# Patient Record
Sex: Female | Born: 1960 | Race: Black or African American | Hispanic: No | Marital: Single | Smoking: Current some day smoker
Health system: Southern US, Community
[De-identification: ages and names within clinical notes are randomized; demographics above are authoritative.]

## PROBLEM LIST (undated history)

## (undated) ENCOUNTER — Encounter

## (undated) ENCOUNTER — Ambulatory Visit: Payer: MEDICARE | Attending: Emergency Medicine | Primary: Emergency Medicine

## (undated) ENCOUNTER — Telehealth

## (undated) ENCOUNTER — Ambulatory Visit: Payer: MEDICARE

## (undated) ENCOUNTER — Ambulatory Visit: Payer: PRIVATE HEALTH INSURANCE

## (undated) ENCOUNTER — Ambulatory Visit
Payer: MEDICARE | Attending: Rehabilitative and Restorative Service Providers" | Primary: Rehabilitative and Restorative Service Providers"

## (undated) ENCOUNTER — Encounter: Attending: Registered" | Primary: Registered"

## (undated) ENCOUNTER — Ambulatory Visit: Payer: Medicaid (Managed Care)

## (undated) ENCOUNTER — Ambulatory Visit

## (undated) ENCOUNTER — Encounter: Attending: Hematology & Oncology | Primary: Hematology & Oncology

## (undated) ENCOUNTER — Ambulatory Visit: Payer: MEDICAID

## (undated) ENCOUNTER — Encounter: Attending: Nurse Practitioner | Primary: Nurse Practitioner

## (undated) ENCOUNTER — Ambulatory Visit: Payer: PRIVATE HEALTH INSURANCE | Attending: Oncology | Primary: Oncology

## (undated) ENCOUNTER — Ambulatory Visit: Payer: MEDICARE | Attending: Physical Medicine & Rehabilitation | Primary: Physical Medicine & Rehabilitation

## (undated) ENCOUNTER — Encounter: Attending: Oncology | Primary: Oncology

## (undated) ENCOUNTER — Ambulatory Visit: Attending: Oncology | Primary: Oncology

## (undated) ENCOUNTER — Telehealth: Attending: Adult Health | Primary: Adult Health

## (undated) ENCOUNTER — Ambulatory Visit: Payer: Medicaid (Managed Care) | Attending: Internal Medicine | Primary: Internal Medicine

## (undated) ENCOUNTER — Ambulatory Visit: Attending: Family | Primary: Family

## (undated) ENCOUNTER — Encounter
Attending: Student in an Organized Health Care Education/Training Program | Primary: Student in an Organized Health Care Education/Training Program

## (undated) ENCOUNTER — Institutional Professional Consult (permissible substitution): Payer: MEDICARE

## (undated) DIAGNOSIS — D75839 Thrombocytosis, unspecified: Secondary | ICD-10-CM

## (undated) DIAGNOSIS — D473 Essential (hemorrhagic) thrombocythemia: Secondary | ICD-10-CM

## (undated) HISTORY — PX: ABDOMINAL HYSTERECTOMY: SHX81

---

## 2006-11-27 HISTORY — PX: CERVICAL FUSION: SHX112

## 2014-04-08 ENCOUNTER — Emergency Department: Payer: Self-pay | Admitting: Emergency Medicine

## 2014-04-08 LAB — BASIC METABOLIC PANEL
Anion Gap: 6 — ABNORMAL LOW (ref 7–16)
BUN: 23 mg/dL — AB (ref 7–18)
CO2: 28 mmol/L (ref 21–32)
Calcium, Total: 9.8 mg/dL (ref 8.5–10.1)
Chloride: 103 mmol/L (ref 98–107)
Creatinine: 0.91 mg/dL (ref 0.60–1.30)
EGFR (Non-African Amer.): >60
Glucose: 95 mg/dL (ref 65–99)
OSMOLALITY: 277 (ref 275–301)
POTASSIUM: 3.8 mmol/L (ref 3.5–5.1)
Sodium: 137 mmol/L (ref 136–145)

## 2014-04-08 LAB — CBC
HCT: 44.4 % (ref 35.0–47.0)
HGB: 14.7 g/dL (ref 12.0–16.0)
MCH: 31.2 pg (ref 26.0–34.0)
MCHC: 33.2 g/dL (ref 32.0–36.0)
MCV: 94 fL (ref 80–100)
Platelet: 766 10*3/uL — ABNORMAL HIGH (ref 150–440)
RBC: 4.73 10*6/uL (ref 3.80–5.20)
RDW: 15 % — ABNORMAL HIGH (ref 11.5–14.5)
WBC: 7.5 10*3/uL (ref 3.6–11.0)

## 2014-04-08 LAB — D-DIMER(ARMC): D-DIMER: 236 ng/mL

## 2015-01-14 ENCOUNTER — Emergency Department: Payer: Self-pay | Admitting: Emergency Medicine

## 2015-07-22 ENCOUNTER — Encounter: Payer: Self-pay | Admitting: Emergency Medicine

## 2015-07-22 ENCOUNTER — Emergency Department
Admission: EM | Admit: 2015-07-22 | Discharge: 2015-07-22 | Disposition: A | Discharge status: Home / Self Care | Payer: Medicare Other | Attending: Emergency Medicine | Admitting: Emergency Medicine

## 2015-07-22 DIAGNOSIS — Z791 Long term (current) use of non-steroidal anti-inflammatories (NSAID): Secondary | ICD-10-CM | POA: Insufficient documentation

## 2015-07-22 DIAGNOSIS — Z79899 Other long term (current) drug therapy: Secondary | ICD-10-CM | POA: Insufficient documentation

## 2015-07-22 DIAGNOSIS — R42 Dizziness and giddiness: Secondary | ICD-10-CM | POA: Insufficient documentation

## 2015-07-22 DIAGNOSIS — Z7982 Long term (current) use of aspirin: Secondary | ICD-10-CM | POA: Insufficient documentation

## 2015-07-22 MED ORDER — VENLAFAXINE HCL ER 75 MG PO CP24: 75.0000 mg | ORAL_CAPSULE | Freq: Every day | ORAL | Status: AC

## 2015-07-22 MED ORDER — VENLAFAXINE HCL ER 75 MG PO CP24
75.0000 mg | ORAL_CAPSULE | Freq: Once | ORAL | Status: AC
  Administered 2015-07-22: 75 mg via ORAL
  Filled 2015-07-22: qty 1

## 2015-07-22 NOTE — ED Notes (Signed)
Pharmacy called about medication. Corene Cornea confirmed medication would be sent up as soon as possible.

## 2015-07-22 NOTE — ED Notes (Signed)
Pt assisted into room by registration. Care to be assumed.

## 2015-07-22 NOTE — ED Notes (Signed)
Patient is resting comfortably.  MD at bedside.

## 2015-07-22 NOTE — ED Notes (Signed)
Pt. States she has been on pristiq for the past year.  Pt. States she ran out of medication 3 days ago and is in the process of changing providers.  Pt. States feeling anxiety today with dizziness and nausea.

## 2015-07-22 NOTE — ED Notes (Signed)
Pt stated she is in the process of switching doctors, and she has run out of her anti-anxiety medication. Pt stated [place] will not see her until the receive her records from her prior PCP.

## 2015-07-22 NOTE — ED Notes (Signed)
Pt with c/o nausea and dizziness off medication, states she can barely open her eyes.

## 2015-07-22 NOTE — ED Provider Notes (Signed)
[facility] Emergency Department Provider Note  ____________________________________________  Time seen: Approximately 182 PM  I have reviewed the triage vital signs and the nursing notes.   HISTORY  Chief Complaint Dizziness    HPI Regina Mullins is a 54 y.o. female with a history of anxiety on Pristiq who presents with dizziness. The patient says that her dizziness has been increasing over the past 2-3 days and she has run out of her Pristiq. She said that she has had this exact presentation in the past when she has been out of her medication. She says that she is currently switching from [facility] over to the [place] and is in between primary care providers. Says that the dizziness is making her nauseous but has not any vomiting. Says that the sensation is of a spinning feeling. No ringing or roaring in her ears. No recent URI symptoms. No focal weakness or numbness. Patient also with history of thrombocytosis with labs drawn on 823 and platelets of 487.   History reviewed. No pertinent past medical history.  There are no active problems to display for this patient.   Past Surgical History  Procedure Laterality Date  . Abdominal hysterectomy    . Cervical fusion  2008    Current Outpatient Rx  Name  Route  Sig  Dispense  Refill  . aspirin EC 325 MG tablet   Oral   Take 325 mg by mouth daily.         . Cholecalciferol (VITAMIN D3) 2000 UNITS TABS   Oral   Take 1 tablet by mouth daily.      1   . etodolac (LODINE) 400 MG tablet   Oral   Take 400 mg by mouth daily.         . hydroxyurea (HYDREA) 500 MG capsule   Oral   Take 4 capsules by mouth daily.      0   . PRISTIQ 50 MG 24 hr tablet   Oral   Take 50 mg by mouth 2 (two) times daily.      0     Dispense as written.     Allergies Shellfish allergy and Codeine  No family history on file.  Social History Social History  Substance Use Topics  . Smoking status: Never  Smoker   . Smokeless tobacco: None  . Alcohol Use: No    Review of Systems Constitutional: No fever/chills Eyes: No visual changes. ENT: No sore throat. Cardiovascular: Denies chest pain. Respiratory: Denies shortness of breath. Gastrointestinal: No abdominal pain.  No nausea, .  No diarrhea.  No constipation. Genitourinary: Negative for dysuria. Musculoskeletal: Negative for back pain. Skin: Negative for rash. Neurological: Negative for headaches, focal weakness or numbness.  10-point ROS otherwise negative.  ____________________________________________   PHYSICAL EXAM:  VITAL SIGNS: ED Triage Vitals  Enc Vitals Group     BP 07/22/15 1927 124/71 mmHg     Pulse Rate 07/22/15 1927 93     Resp 07/22/15 1927 17     Temp 07/22/15 1927 98.3 F (36.8 C)     Temp Source 07/22/15 1927 Oral     SpO2 07/22/15 1927 96 %     Weight 07/22/15 1927 262 lb (118.842 kg)     Height 07/22/15 1927 5\' 3"  (1.6 m)     Head Cir --      Peak Flow --      Pain Score 07/22/15 1934 0     Pain Loc --  Pain Edu? --      Excl. in [place]? --     Constitutional: Alert and oriented. Well appearing and in no acute distress. Eyes: Conjunctivae are normal. PERRL. EOMI. Head: Atraumatic. Nose: No congestion/rhinnorhea. Mouth/Throat: Mucous membranes are moist.  Oropharynx non-erythematous. Neck: No stridor.   Cardiovascular: Normal rate, regular rhythm. Grossly normal heart sounds.  Good peripheral circulation. Respiratory: Normal respiratory effort.  No retractions. Lungs CTAB. Gastrointestinal: Soft and nontender. No distention. No abdominal bruits. No CVA tenderness. Musculoskeletal: No lower extremity tenderness nor edema.  No joint effusions. Neurologic:  Normal speech and language. No gross focal neurologic deficits are appreciated. Negative for ataxia on finger to nose testing. No nystagmus. Skin:  Skin is warm, dry and intact. No rash noted. Psychiatric: Mood and affect are normal. Speech  and behavior are normal.  ____________________________________________   LABS (all labs ordered are listed, but only abnormal results are displayed)  Labs Reviewed - No data to display ____________________________________________  EKG   ____________________________________________  RADIOLOGY   ____________________________________________   PROCEDURES    ____________________________________________   INITIAL IMPRESSION / ASSESSMENT AND PLAN / ED COURSE  Pertinent labs & imaging results that were available during my care of the patient were reviewed by me and considered in my medical decision making (see chart for details).  ----------------------------------------- 10:55 PM on 07/22/2015 -----------------------------------------  After dose of venlafaxine the patient is feeling back to her baseline. She was able to ambulate without any assistance and is no longer dizzy. 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 She says that due to her current insurance that venlafaxine would be covered and will be more affordable for her. Would prefer this as an outpatient. We'll prescribe venlafaxine for her and she will follow-up at the [facility] clinic where she is currently in the process of scheduling a primary care appointment. ____________________________________________   FINAL CLINICAL IMPRESSION(S) / ED DIAGNOSES  Acute dizziness, from withdrawal of Pristiq per the patient. Initial visit.    Orbie Pyo, MD 07/22/15 [phone]

## 2015-07-22 NOTE — Discharge Instructions (Signed)

## 2015-07-22 NOTE — ED Notes (Signed)
Discharge instructions reviewed with pt, RX given. All questions answered, pt to be d/c'd home.

## 2015-07-22 NOTE — ED Notes (Signed)
Patient is resting comfortably. Pt denies pain, just with complaint of slight nausea.

## 2015-07-22 NOTE — ED Notes (Signed)
Regina Mullins given to pt .

## 2015-07-22 NOTE — ED Notes (Signed)
Pristiq 50mg  BID PO.

## 2015-07-22 NOTE — ED Notes (Signed)
MD at bedside. 

## 2015-07-23 LAB — GLUCOSE, CAPILLARY: Glucose-Capillary: 79 mg/dL (ref 65–99)

## 2016-05-16 ENCOUNTER — Encounter: Payer: Self-pay | Admitting: Emergency Medicine

## 2016-05-16 ENCOUNTER — Emergency Department
Admission: EM | Admit: 2016-05-16 | Discharge: 2016-05-16 | Disposition: A | Discharge status: Home / Self Care | Payer: Medicare Other | Attending: Emergency Medicine | Admitting: Emergency Medicine

## 2016-05-16 DIAGNOSIS — Y929 Unspecified place or not applicable: Secondary | ICD-10-CM | POA: Insufficient documentation

## 2016-05-16 DIAGNOSIS — W57XXXA Bitten or stung by nonvenomous insect and other nonvenomous arthropods, initial encounter: Secondary | ICD-10-CM | POA: Insufficient documentation

## 2016-05-16 DIAGNOSIS — S30861A Insect bite (nonvenomous) of abdominal wall, initial encounter: Secondary | ICD-10-CM | POA: Diagnosis present

## 2016-05-16 DIAGNOSIS — Y999 Unspecified external cause status: Secondary | ICD-10-CM | POA: Diagnosis not present

## 2016-05-16 DIAGNOSIS — Z7982 Long term (current) use of aspirin: Secondary | ICD-10-CM | POA: Insufficient documentation

## 2016-05-16 DIAGNOSIS — Y939 Activity, unspecified: Secondary | ICD-10-CM | POA: Insufficient documentation

## 2016-05-16 DIAGNOSIS — L03311 Cellulitis of abdominal wall: Secondary | ICD-10-CM

## 2016-05-16 MED ORDER — IBUPROFEN 600 MG PO TABS: 600.0000 mg | ORAL_TABLET | Freq: Three times a day (TID) | ORAL | Status: AC | PRN

## 2016-05-16 MED ORDER — SULFAMETHOXAZOLE-TRIMETHOPRIM 800-160 MG PO TABS: 1.0000 | ORAL_TABLET | Freq: Two times a day (BID) | ORAL | Status: AC

## 2016-05-16 NOTE — ED Provider Notes (Signed)
[facility] Emergency Department Provider Note  ____________________________________________  Time seen: Approximately 10:50 AM  I have reviewed the triage vital signs and the nursing notes.   HISTORY  Chief Complaint Insect Bite    HPI Regina Mullins is a 55 y.o. female who arrives at the emergency department with complaints of a bug bite to the right abdomen worsening since yesterday. Patient was at a cookout with family where she reports being bitten by multiple bugs although she did not visualize them. Yesterday the area became warm to the touch, edematous, itching and painful. Pain is a shooting pain that is rated an 8.5/10. She denies fevers but has been experiencing chills, diaphoresis and increased fatigue. She notes that she noticed increased blurry vision on Saturday/Sunday and vomited twice yesterday but is not having any nausea today. Yesterday in the shower she noticed some thick, yellow drainage from the lesion. Denies hearing changes, lymphadenopathy, chest pain, palpitations, shortness of breath and dyspnea. Patient rates her pain as a 9 out of 10 at this time.     History reviewed. No pertinent past medical history.  There are no active problems to display for this patient.   Past Surgical History  Procedure Laterality Date  . Abdominal hysterectomy    . Cervical fusion  2008    Current Outpatient Rx  Name  Route  Sig  Dispense  Refill  . aspirin EC 325 MG tablet   Oral   Take 325 mg by mouth daily.         . Cholecalciferol (VITAMIN D3) 2000 UNITS TABS   Oral   Take 1 tablet by mouth daily.      1   . etodolac (LODINE) 400 MG tablet   Oral   Take 400 mg by mouth daily.         . hydroxyurea (HYDREA) 500 MG capsule   Oral   Take 4 capsules by mouth daily.      0   . ibuprofen (ADVIL,MOTRIN) 600 MG tablet   Oral   Take 1 tablet (600 mg total) by mouth every 8 (eight) hours as needed.   30 tablet   0   .  PRISTIQ 50 MG 24 hr tablet   Oral   Take 50 mg by mouth 2 (two) times daily.      0     Dispense as written.   . sulfamethoxazole-trimethoprim (BACTRIM DS,SEPTRA DS) 800-160 MG tablet   Oral   Take 1 tablet by mouth 2 (two) times daily.   20 tablet   0   . venlafaxine XR (EFFEXOR-XR) 75 MG 24 hr capsule   Oral   Take 1 capsule (75 mg total) by mouth daily with breakfast.   30 capsule   0     Allergies Shellfish allergy and Codeine  No family history on file.  Social History Social History  Substance Use Topics  . Smoking status: Never Smoker   . Smokeless tobacco: None  . Alcohol Use: No    Review of Systems Constitutional: No fever/chills Cardiovascular: Denies chest pain. Respiratory: Denies shortness of breath. Gastrointestinal:   No nausea, no vomiting.   Musculoskeletal: Negative for back pain. Skin: Positive for infection. Neurological: Negative for headaches, focal weakness or numbness.  10-point ROS otherwise negative.  ____________________________________________   PHYSICAL EXAM:  VITAL SIGNS: ED Triage Vitals  Enc Vitals Group     BP --      Pulse Rate 05/16/16 1026 77  Resp 05/16/16 1026 16     Temp 05/16/16 1026 98.9 F (37.2 C)     Temp Source 05/16/16 1026 Oral     SpO2 05/16/16 1026 96 %     Weight 05/16/16 1026 243 lb (110.224 kg)     Height 05/16/16 1026 5\' 3"  (1.6 m)     Head Cir --      Peak Flow --      Pain Score 05/16/16 1026 9     Pain Loc --      Pain Edu? --      Excl. in [place]? --     Constitutional: Alert and oriented. Well appearing and in no acute distress. Eyes: Conjunctivae are normal. PERRL. EOMI. Head: Atraumatic. Nose: No congestion/rhinnorhea. Neck: No stridor.   Cardiovascular: Normal rate, regular rhythm. Grossly normal heart sounds.  Good peripheral circulation. Respiratory: Normal respiratory effort.  No retractions. Lungs CTAB. Gastrointestinal: Soft and nontender. No distention.  Musculoskeletal:  No lower extremity tenderness nor edema.  No joint effusions. Neurologic:  Normal speech and language. No gross focal neurologic deficits are appreciated. No gait instability. Skin:  Skin is warm, dry. There is a 13 cm x 10 cm erythematous this tender area on the abdomen. There is no active drainage at this time. There is no fluctuant area. In the center there is an indurated area which possibly could've been either the insect bite or definitely the point of infection. Psychiatric: Mood and affect are normal. Speech and behavior are normal.  ____________________________________________   LABS (all labs ordered are listed, but only abnormal results are displayed)  Labs Reviewed - No data to display   PROCEDURES  Procedure(s) performed: None  Critical Care performed: No  ____________________________________________   INITIAL IMPRESSION / ASSESSMENT AND PLAN / ED COURSE  Pertinent labs & imaging results that were available during my care of the patient were reviewed by me and considered in my medical decision making (see chart for details).  Patient was given Bactrim DS twice a day for 10 days along with ibuprofen 600 mg 3 times a day with food as needed for pain and inflammation. Patient is to follow-up with her primary care doctor or return to the emergency room if any severe worsening of her symptoms. She is also encouraged to use warm compresses to the area frequently as this may decrease some of the swelling. ____________________________________________   FINAL CLINICAL IMPRESSION(S) / ED DIAGNOSES  Final diagnoses:  Cellulitis of right abdominal wall      NEW MEDICATIONS STARTED DURING THIS VISIT:  Discharge Medication List as of 05/16/2016 11:19 AM    START taking these medications   Details  ibuprofen (ADVIL,MOTRIN) 600 MG tablet Take 1 tablet (600 mg total) by mouth every 8 (eight) hours as needed., Starting 05/16/2016, Until Discontinued, Print      sulfamethoxazole-trimethoprim (BACTRIM DS,SEPTRA DS) 800-160 MG tablet Take 1 tablet by mouth 2 (two) times daily., Starting 05/16/2016, Until Discontinued, Print         Note:  This document was prepared using Dragon voice recognition software and may include unintentional dictation errors.    Johnn Hai, PA-C 05/16/16 1208

## 2016-05-16 NOTE — Discharge Instructions (Signed)
Cellulitis Cellulitis is an infection of the skin and the tissue under the skin. The infected area is usually red and tender. This happens most often in the arms and lower legs. HOME CARE   Take your antibiotic medicine as told. Finish the medicine even if you start to feel better.  Keep the infected arm or leg raised (elevated).  Put a warm cloth on the area up to 4 times per day.  Only take medicines as told by your doctor.  Keep all doctor visits as told. GET HELP IF:  You see red streaks on the skin coming from the infected area.  Your red area gets bigger or turns a dark color.  Your bone or joint under the infected area is painful after the skin heals.  Your infection comes back in the same area or different area.  You have a puffy (swollen) bump in the infected area.  You have new symptoms.  You have a fever. GET HELP RIGHT AWAY IF:   You feel very sleepy.  You throw up (vomit) or have watery poop (diarrhea).  You feel sick and have muscle aches and pains.   This information is not intended to replace advice given to you by your health care provider. Make sure you discuss any questions you have with your health care provider.   Document Released: 05/01/2008 Document Revised: 08/04/2015 Document Reviewed: 01/29/2012 Elsevier Interactive Patient Education 2016 [employer].    Use warm compresses frequently to the area. Begin taking antibiotics as directed twice a day for the next 10 days. Ibuprofen as needed for pain and inflammation. Return to the emergency room for any severe worsening of her symptoms or call [facility] if incision and drainage is needed.

## 2016-05-16 NOTE — ED Notes (Signed)
States she was bitten by something to right side of abd couple of days   Area is swollen and tender to touch with some drainage noted

## 2016-06-11 ENCOUNTER — Encounter: Payer: Self-pay | Admitting: Emergency Medicine

## 2016-06-11 ENCOUNTER — Emergency Department
Admission: EM | Admit: 2016-06-11 | Discharge: 2016-06-11 | Disposition: A | Discharge status: Home / Self Care | Payer: Medicare Other | Attending: Emergency Medicine | Admitting: Emergency Medicine

## 2016-06-11 DIAGNOSIS — Z5321 Procedure and treatment not carried out due to patient leaving prior to being seen by health care provider: Secondary | ICD-10-CM | POA: Insufficient documentation

## 2016-06-11 DIAGNOSIS — L03311 Cellulitis of abdominal wall: Secondary | ICD-10-CM

## 2016-06-11 DIAGNOSIS — M79605 Pain in left leg: Secondary | ICD-10-CM | POA: Diagnosis present

## 2016-06-11 LAB — CBC
HCT: 42.3 % (ref 35.0–47.0)
HEMOGLOBIN: 14.2 g/dL (ref 12.0–16.0)
MCH: 31.3 pg (ref 26.0–34.0)
MCHC: 33.5 g/dL (ref 32.0–36.0)
MCV: 93.3 fL (ref 80.0–100.0)
PLATELETS: 860 10*3/uL — AB (ref 150–440)
RBC: 4.54 MIL/uL (ref 3.80–5.20)
RDW: 15.3 % — AB (ref 11.5–14.5)
WBC: 8.4 10*3/uL (ref 3.6–11.0)

## 2016-06-11 LAB — COMPREHENSIVE METABOLIC PANEL
ALBUMIN: 4.2 g/dL (ref 3.5–5.0)
ALT: 13 U/L — ABNORMAL LOW (ref 14–54)
ANION GAP: 8 (ref 5–15)
AST: 15 U/L (ref 15–41)
Alkaline Phosphatase: 87 U/L (ref 38–126)
BILIRUBIN TOTAL: 0.7 mg/dL (ref 0.3–1.2)
BUN: 26 mg/dL — AB (ref 6–20)
CALCIUM: 9.4 mg/dL (ref 8.9–10.3)
CO2: 30 mmol/L (ref 22–32)
CREATININE: 1.22 mg/dL — AB (ref 0.44–1.00)
Chloride: 103 mmol/L (ref 101–111)
GFR, EST AFRICAN AMERICAN: 57 mL/min — AB (ref >60)
GFR, EST NON AFRICAN AMERICAN: 49 mL/min — AB (ref >60)
GLUCOSE: 102 mg/dL — AB (ref 65–99)
Potassium: 3.5 mmol/L (ref 3.5–5.1)
Sodium: 141 mmol/L (ref 135–145)
TOTAL PROTEIN: 7.9 g/dL (ref 6.5–8.1)

## 2016-06-11 NOTE — ED Notes (Signed)
Pt states she cannot wait any longer and will just take the ABX she has at home for a previous infection on her abdomen. Pt informed that the EDP would be in as soon as possible, but pt insists on leaving at this time.

## 2016-06-11 NOTE — ED Notes (Addendum)
Patient presents to the ED with wound to her left leg.  Patient states, "I think it started as a spider bite."  There is a reddened area about the size of a baseball to patient's left calf.  Patient reports her left foot is somewhat swollen.  Patient states she first noticed area about 1 week ago.  Patient ambulatory to triage without obvious distress.  Patient reports lower abdominal pain with diarrhea x 4 today as well.

## 2016-06-11 NOTE — ED Notes (Signed)
Pt was given specimen cup and instructed to notify staff when a sample has been obtained

## 2016-06-22 ENCOUNTER — Emergency Department: Payer: Medicare Other

## 2016-06-22 ENCOUNTER — Emergency Department
Admission: EM | Admit: 2016-06-22 | Discharge: 2016-06-22 | Disposition: A | Discharge status: Home / Self Care | Payer: Medicare Other | Attending: Emergency Medicine | Admitting: Emergency Medicine

## 2016-06-22 ENCOUNTER — Encounter: Payer: Self-pay | Admitting: Emergency Medicine

## 2016-06-22 DIAGNOSIS — K521 Toxic gastroenteritis and colitis: Secondary | ICD-10-CM | POA: Insufficient documentation

## 2016-06-22 DIAGNOSIS — R109 Unspecified abdominal pain: Secondary | ICD-10-CM

## 2016-06-22 DIAGNOSIS — T6591XA Toxic effect of unspecified substance, accidental (unintentional), initial encounter: Secondary | ICD-10-CM | POA: Diagnosis not present

## 2016-06-22 DIAGNOSIS — T3695XA Adverse effect of unspecified systemic antibiotic, initial encounter: Secondary | ICD-10-CM | POA: Diagnosis not present

## 2016-06-22 DIAGNOSIS — Z7982 Long term (current) use of aspirin: Secondary | ICD-10-CM | POA: Insufficient documentation

## 2016-06-22 HISTORY — DX: Essential (hemorrhagic) thrombocythemia: D47.3

## 2016-06-22 HISTORY — DX: Thrombocytosis, unspecified: D75.839

## 2016-06-22 LAB — GASTROINTESTINAL PANEL BY PCR, STOOL (REPLACES STOOL CULTURE)
ADENOVIRUS F40/41: NOT DETECTED
ASTROVIRUS: NOT DETECTED
CYCLOSPORA CAYETANENSIS: NOT DETECTED
Campylobacter species: NOT DETECTED
Cryptosporidium: NOT DETECTED
E. COLI O157: NOT DETECTED
ENTAMOEBA HISTOLYTICA: NOT DETECTED
ENTEROAGGREGATIVE E COLI (EAEC): NOT DETECTED
ENTEROTOXIGENIC E COLI (ETEC): NOT DETECTED
Enteropathogenic E coli (EPEC): NOT DETECTED
GIARDIA LAMBLIA: NOT DETECTED
Norovirus GI/GII: NOT DETECTED
Plesimonas shigelloides: NOT DETECTED
Rotavirus A: NOT DETECTED
SAPOVIRUS (I, II, IV, AND V): NOT DETECTED
Salmonella species: NOT DETECTED
Shiga like toxin producing E coli (STEC): NOT DETECTED
Shigella/Enteroinvasive E coli (EIEC): NOT DETECTED
VIBRIO CHOLERAE: NOT DETECTED
VIBRIO SPECIES: NOT DETECTED
Yersinia enterocolitica: NOT DETECTED

## 2016-06-22 LAB — URINALYSIS COMPLETE WITH MICROSCOPIC (ARMC ONLY)
BILIRUBIN URINE: NEGATIVE
Glucose, UA: NEGATIVE mg/dL
HGB URINE DIPSTICK: NEGATIVE
KETONES UR: NEGATIVE mg/dL
LEUKOCYTES UA: NEGATIVE
Nitrite: NEGATIVE
PH: 6 (ref 5.0–8.0)
Protein, ur: NEGATIVE mg/dL
SPECIFIC GRAVITY, URINE: 1.008 (ref 1.005–1.030)

## 2016-06-22 LAB — C DIFFICILE QUICK SCREEN W PCR REFLEX
C Diff antigen: NEGATIVE
C Diff interpretation: NOT DETECTED
C Diff toxin: NEGATIVE

## 2016-06-22 LAB — COMPREHENSIVE METABOLIC PANEL
ALK PHOS: 89 U/L (ref 38–126)
ALT: 25 U/L (ref 14–54)
ANION GAP: 6 (ref 5–15)
AST: 26 U/L (ref 15–41)
Albumin: 3.9 g/dL (ref 3.5–5.0)
BUN: 8 mg/dL (ref 6–20)
CALCIUM: 9.9 mg/dL (ref 8.9–10.3)
CHLORIDE: 104 mmol/L (ref 101–111)
CO2: 31 mmol/L (ref 22–32)
CREATININE: 0.81 mg/dL (ref 0.44–1.00)
Glucose, Bld: 85 mg/dL (ref 65–99)
Potassium: 3.7 mmol/L (ref 3.5–5.1)
SODIUM: 141 mmol/L (ref 135–145)
Total Bilirubin: 0.4 mg/dL (ref 0.3–1.2)
Total Protein: 7.6 g/dL (ref 6.5–8.1)

## 2016-06-22 LAB — CBC
HCT: 42 % (ref 35.0–47.0)
HEMOGLOBIN: 14.6 g/dL (ref 12.0–16.0)
MCH: 32.1 pg (ref 26.0–34.0)
MCHC: 34.8 g/dL (ref 32.0–36.0)
MCV: 92.4 fL (ref 80.0–100.0)
PLATELETS: 878 10*3/uL — AB (ref 150–440)
RBC: 4.55 MIL/uL (ref 3.80–5.20)
RDW: 15.5 % — ABNORMAL HIGH (ref 11.5–14.5)
WBC: 7.2 10*3/uL (ref 3.6–11.0)

## 2016-06-22 LAB — LIPASE, BLOOD: LIPASE: 19 U/L (ref 11–51)

## 2016-06-22 MED ORDER — DIATRIZOATE MEGLUMINE & SODIUM 66-10 % PO SOLN
15.0000 mL | Freq: Once | ORAL | Status: AC
  Administered 2016-06-22: 15 mL via ORAL

## 2016-06-22 MED ORDER — IOPAMIDOL (ISOVUE-300) INJECTION 61%
100.0000 mL | Freq: Once | INTRAVENOUS | Status: AC | PRN
  Administered 2016-06-22: 100 mL via INTRAVENOUS
  Filled 2016-06-22: qty 100

## 2016-06-22 MED ORDER — SODIUM CHLORIDE 0.9 % IV BOLUS (SEPSIS)
1000.0000 mL | Freq: Once | INTRAVENOUS | Status: AC
  Administered 2016-06-22: 1000 mL via INTRAVENOUS

## 2016-06-22 NOTE — ED Triage Notes (Signed)
Pt presents to ED with reports of epigastric pain for over one week. Pt reports the pain as a burning and cramping sensation. Pt reports seeing bright red blood in her stools. Pt denies having hemorrhoids.

## 2016-06-22 NOTE — ED Notes (Signed)
Pt agitated with provider and this nurse because she cannot obtain a work note for previous days that she missed from work - she is also agitated that the provider recommended that she follow up with her PCP for care of the abscess on her lower leg - she requested to speak to an Scientist, physiological and requested copies of all her medical records from 3 weeks ago and today - this nurse explained to pt how to obtain records through medical records and also ask the doctor again about back dating the work note (this request was denied and pt informed) - she then demanded to speak to the charge nurse and refused to sign the release paperwork - Megan RN went in to speak with pt and pt still left without signing and was agitated at the care she had received

## 2016-06-22 NOTE — ED Provider Notes (Addendum)
[facility] Emergency Department Provider Note  ____________________________________________   I have reviewed the triage vital signs and the nursing notes.   HISTORY  Chief Complaint Abdominal Pain and Blood In Stools    HPI Regina Mullins is a 55 y.o. female with no history of diverticulitis presents with cramping left-sided abdominal pain and diarrhea for the last week. She denies any fever or vomiting. Patient is on antibiotics for a well-healing abscess on her left leg. She denies any significant bleeding from her bottom but has noticed trace amounts of blood on her toilet paper. She denies any rectal pain. She denies any history of anticoagulation or easy bleeding. She is not lightheaded.    Past Medical History:  Diagnosis Date  . Thrombocytosis ([place])     There are no active problems to display for this patient.   Past Surgical History:  Procedure Laterality Date  . ABDOMINAL HYSTERECTOMY    . CERVICAL FUSION  2008    Current Outpatient Rx  . Order #: [id number] Class: Historical Med  . Order #: [id number] Class: Historical Med  . Order #: [id number] Class: Historical Med  . Order #: [id number] Class: Historical Med  . Order #: [id number] Class: Print  . Order #: [id number] Class: Historical Med  . Order #: [id number] Class: Print  . Order #: [id number] Class: Print    Allergies Shellfish allergy and Codeine  No family history on file.  Social History Social History  Substance Use Topics  . Smoking status: Never Smoker  . Smokeless tobacco: Not on file  . Alcohol use No    Review of Systems Constitutional: No fever/chills Eyes: No visual changes. ENT: No sore throat. No stiff neck no neck pain Cardiovascular: Denies chest pain. Respiratory: Denies shortness of breath. Gastrointestinal:   no vomiting.  Positive diarrhea.  No constipation. Genitourinary: Negative for dysuria. Musculoskeletal: Negative lower extremity  swelling Skin: Negative for rash. Neurological: Negative for severe headaches, focal weakness or numbness. 10-point ROS otherwise negative.  ____________________________________________   PHYSICAL EXAM:  VITAL SIGNS: ED Triage Vitals  Enc Vitals Group     BP 06/22/16 1322 (!) 159/94     Pulse Rate 06/22/16 1322 77     Resp 06/22/16 1322 18     Temp 06/22/16 1322 98.3 F (36.8 C)     Temp Source 06/22/16 1322 Oral     SpO2 06/22/16 1322 100 %     Weight 06/22/16 1322 237 lb (107.5 kg)     Height 06/22/16 1322 _0  (1.6 m)     Head Circumference --      Peak Flow --      Pain Score 06/22/16 1326 6     Pain Loc --      Pain Edu? --      Excl. in [place]? --     Constitutional: Alert and oriented. Well appearing and in no acute distress. Eyes: Conjunctivae are normal. PERRL. EOMI. Head: Atraumatic. Nose: No congestion/rhinnorhea. Mouth/Throat: Mucous membranes are moist.  Oropharynx non-erythematous. Neck: No stridor.   Nontender with no meningismus Cardiovascular: Normal rate, regular rhythm. Grossly normal heart sounds.  Good peripheral circulation. Respiratory: Normal respiratory effort.  No retractions. Lungs CTAB. Abdominal: Soft and There is limits 100 palpation left lower quadrant. No distention. No guarding no rebound Back:  There is no focal tenderness or step off.  there is no midline tenderness there are no lesions noted. there is no CVA tenderness Musculoskeletal: No lower extremity tenderness, no upper extremity tenderness. No joint effusions,  no DVT signs strong distal pulses no edema Neurologic:  Normal speech and language. No gross focal neurologic deficits are appreciated.  Skin:  Skin is warm, dry and intact. No rash noted there is a well-healing abscess left pretibial region with no evidence of lymphogenic spread,  there is a small area of ulceration with no significant surrounding erythema and no fluctuance. Psychiatric: Mood and affect are normal. Speech and  behavior are normal.  ____________________________________________   LABS (all labs ordered are listed, but only abnormal results are displayed)  Labs Reviewed  CBC - Abnormal; Notable for the following:       Result Value   RDW 15.5 (*)    Platelets 878 (*)    All other components within normal limits  URINALYSIS COMPLETEWITH MICROSCOPIC ([facility] ONLY) - Abnormal; Notable for the following:    Color, Urine YELLOW (*)    APPearance CLEAR (*)    Bacteria, UA FEW (*)    Squamous Epithelial / LPF 0-5 (*)    All other components within normal limits  GASTROINTESTINAL PANEL BY PCR, STOOL (REPLACES STOOL CULTURE)  C DIFFICILE QUICK SCREEN W PCR REFLEX  LIPASE, BLOOD  COMPREHENSIVE METABOLIC PANEL   ____________________________________________  EKG  I personally interpreted any EKGs ordered by me or triage Normal sinus rhythm rate 75 bpm no acute ST elevation or acute ST depression normal axis unremarkable EKG ____________________________________________  RADIOLOGY  I reviewed any imaging ordered by me or triage that were performed during my shift and, if possible, patient and/or family made aware of any abnormal findings. ____________________________________________   PROCEDURES  Procedure(s) performed: None  Procedures  Critical Care performed: None  ____________________________________________   INITIAL IMPRESSION / ASSESSMENT AND PLAN / ED COURSE  Pertinent labs & imaging results that were available during my care of the patient were reviewed by me and considered in my medical decision making (see chart for details).  Patient with reproducible left-sided abdominal discomfort which is quite minimal in the context of diarrhea. Blood work is reassuring. Patient for not of a rectal exam at this time. She did provide a stool sample which was not bloody or guaiac positive. There is no evidence of significant GI bleed. CT scan is reassuring, this was obtained to rule out  diverticulitis which apparently is very strongly present in the patient's family and it was a source of some concern to her. Patient therefore has a diarrhea associated with her antibiotics. The stool sample she gave [place] was not watery, I have low suspicion for C. difficile and we did send it for C. difficile as well as a stool PCR culture which is pending. Blood work is reassuring urine is reassuring abdominal exam is reassuring.     Clinical Course  Comment By Time  ----------------------------------------- 6:06 PM on _0 @ ----------------------------------------- Patient remains very benign, abdomen completely benign at this time. This is most likely diarrhea associated with her antibiotics. Her wound is very well-healing, I do not think the benefit of ongoing antibiotic outweighs the risk and I have asked her to stop antibiotics given her diarrhea. She does not have C. difficile. Which is reassuring as well. The rest of her stool culture is still pending however her blood work and CT are quite reassuring as is her urine vital signs and exam. Return if cautioned and follow-up given and understood she know she should see her doctor tomorrow to have a wound check to make sure he is not getting worse off antibiotics. The patient is requesting  a work note which we'll give her. At this time, there does not appear to be anything more sinister going on then abdominal cramping with mild diarrhea in the context of antibiotic use. Schuyler Amor, MD 07/27 1806   ____----------------------------------------- 6:47 PM on 06/22/2016 -----------------------------------------   ____________________Upon discharge patient became suddenly very angry. She was angry that I could not backdate her work note to some time in the past few days as she apparently was missing work before she came in. We explained that she could have a note that designated when she was here and when she could go back and I have no idea  how much work she is actually missed in the past. Despite having extensive discussions about what to do for her ongoing wound on her leg, patient became very upset that I wasn't giving her other antibiotics I believe. It is really not clear why she became so upset. Multiple efforts to try to address her concerns seem to be met with other angry concerns. In any event, I have extensively given her follow-up discussion and she elected to leave without signing paperwork which is certainly her choice.   FINAL CLINICAL IMPRESSION(S) / ED DIAGNOSES  Final diagnoses:  Abdominal pain      This chart was dictated using voice recognition software.  Despite best efforts to proofread,  errors can occur which can change meaning.      Schuyler Amor, MD 06/22/16 1718    Schuyler Amor, MD 06/22/16 [id number]

## 2016-06-22 NOTE — ED Notes (Signed)
This RN to bedside to attempt to appease patient and discuss what MD had recommended based on rapport this RN had with patient. Pt repeatedly asks this RN what would MD do if she had been seen primarily for ulcer on her leg, this RN stated to patient that this RN could not answer that question due to this RN not being an MD and that that question was out of my scope of practice. This RN instructed patient that per MD pt was to stop taking abx (due to that was causing her diarrhea and abdominal pain, stop putting peroxide in the wound, and keep the wound dry and clean. Pt states "well is he going to give me a new abx?" This RN told patient that if it wasn't included in D/C packet that MD had not written one. Pt states "why didn't the doctor tell me to stop putting peroxide on it?". This RN told patient that answer was out of my scope of practice. This RN offered to go get MD, pt states "well isn't the doctor at supper and won't be back for 30-40 minutes?" This RN went to MD and notified him of patient wanting to speak to him, pt then proceeds to walk past this RN and MD without speaking or acknowledging this RN's attempts to get her to E-Sign, MD aware of situation at this time.

## 2017-01-30 IMAGING — CT CT ABD-PELV W/ CM
2 of 5 series · 17 of 46 positions shown, 19 images · IV contrast (APPLIED)
Comparison: None.

All

CLINICAL DATA: Abdominal pain for 1 week. Bright red blood in the
stool.

EXAM:
CT ABDOMEN AND PELVIS WITH CONTRAST
TECHNIQUE: Multidetector CT imaging of the abdomen and pelvis was performed
using the standard protocol following bolus administration of
intravenous contrast.
CONTRAST:  100mL M0HHFE-1WW IOPAMIDOL (M0HHFE-1WW) INJECTION 61%

[Series 2: axial st · axial · 0.88mm/px · z∈[-192,+263]mm · 14 of 103 slices shown, 16 images]
[im 6/103  soft-tissue]
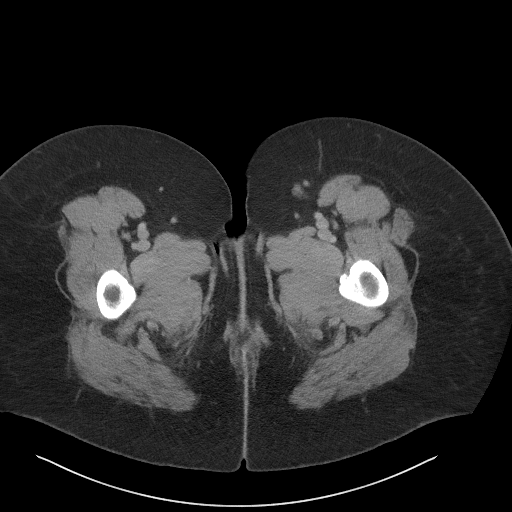
[im 6/103  bone]
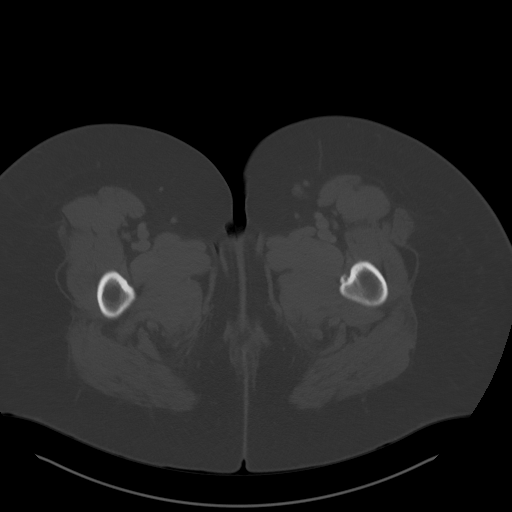
[im 16/103  soft-tissue]
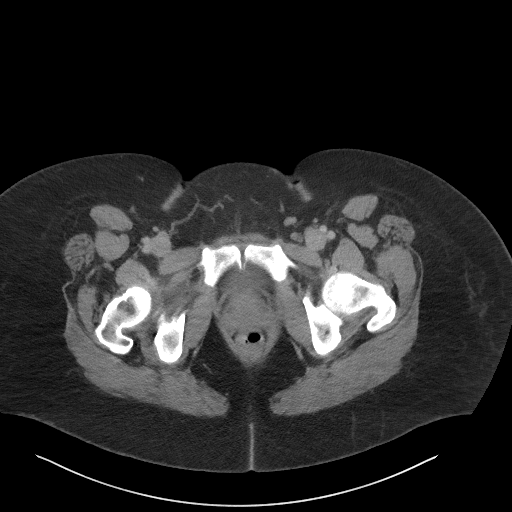
[im 21/103  soft-tissue]
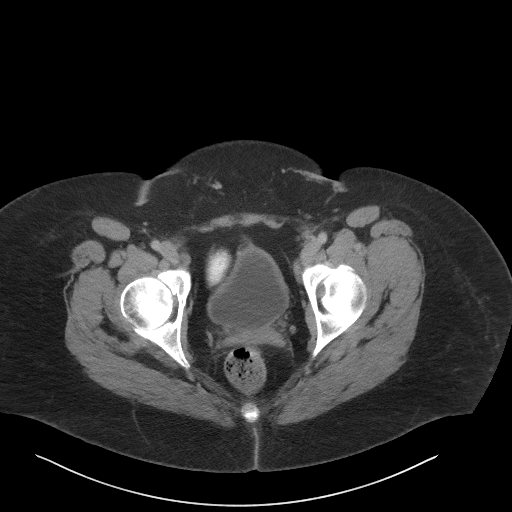
[im 26/103  soft-tissue]
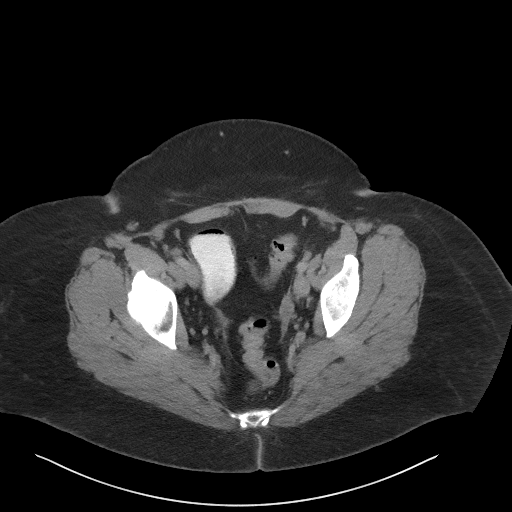
[im 36/103  soft-tissue]
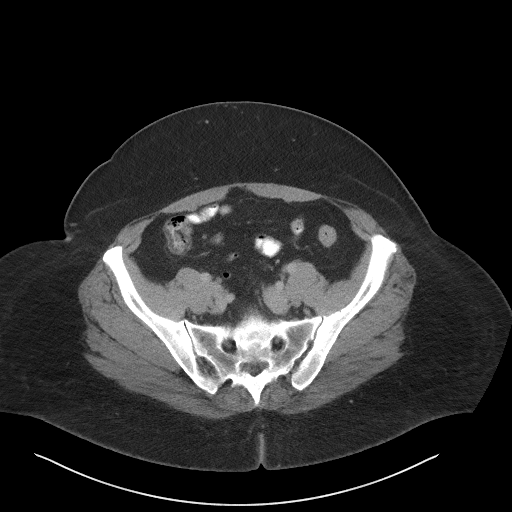
[im 41/103  soft-tissue]
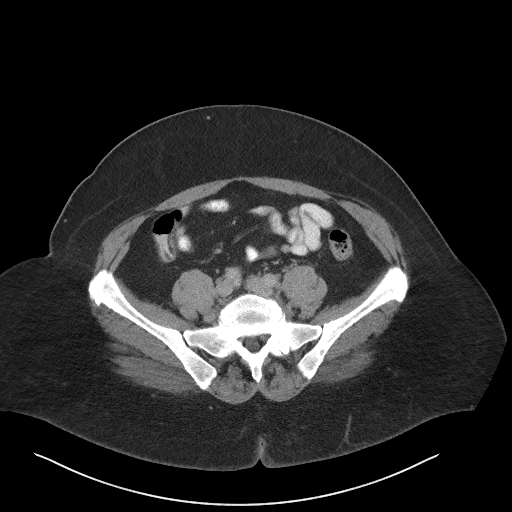
[im 46/103  soft-tissue]
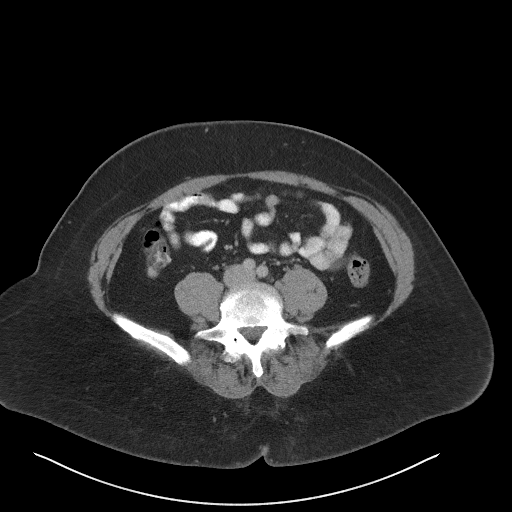
[im 57/103  soft-tissue]
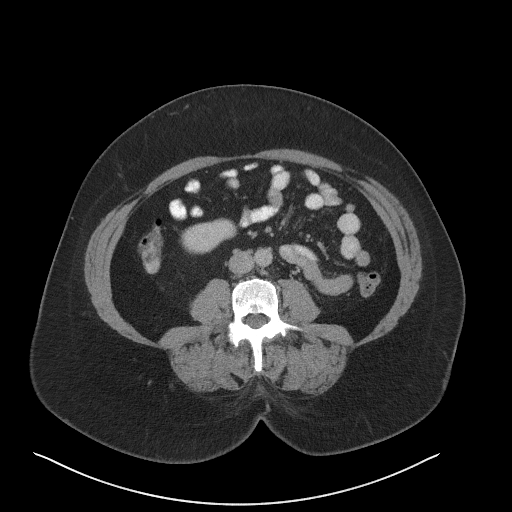
[im 62/103  soft-tissue]
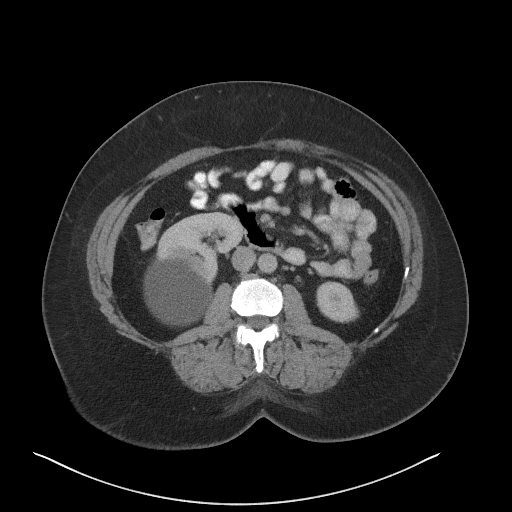
[im 62/103  bone]
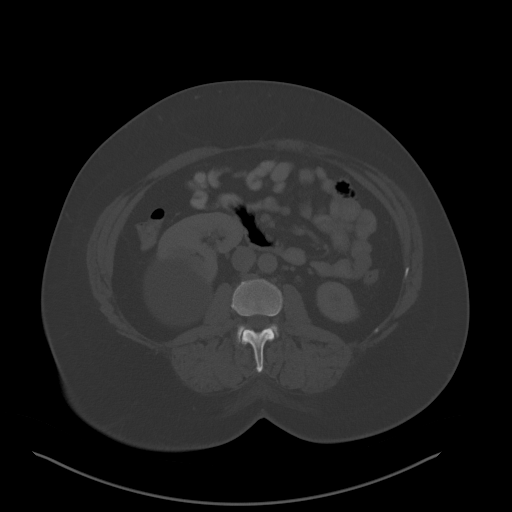
[im 67/103  soft-tissue]
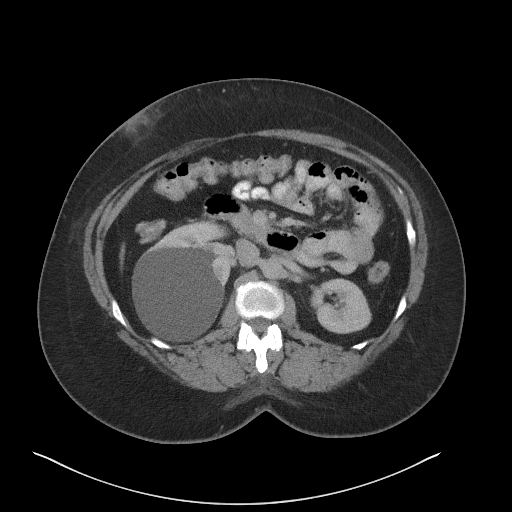
[im 77/103  soft-tissue]
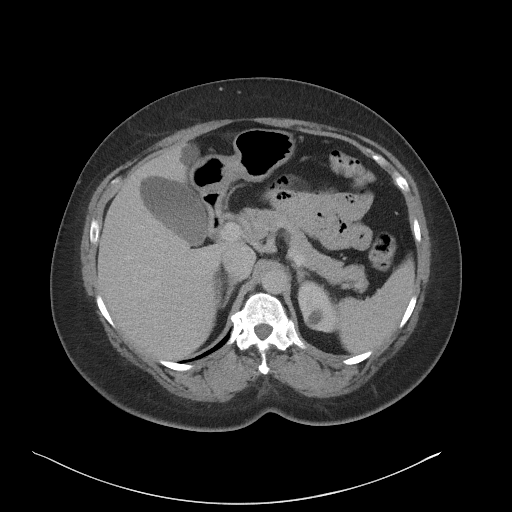
[im 82/103  soft-tissue]
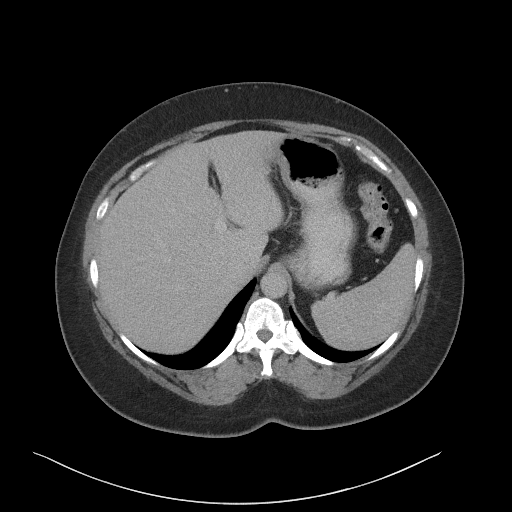
[im 87/103  soft-tissue]
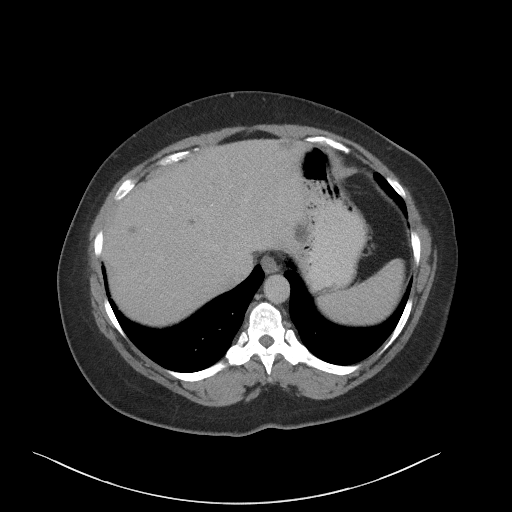
[im 97/103  soft-tissue]
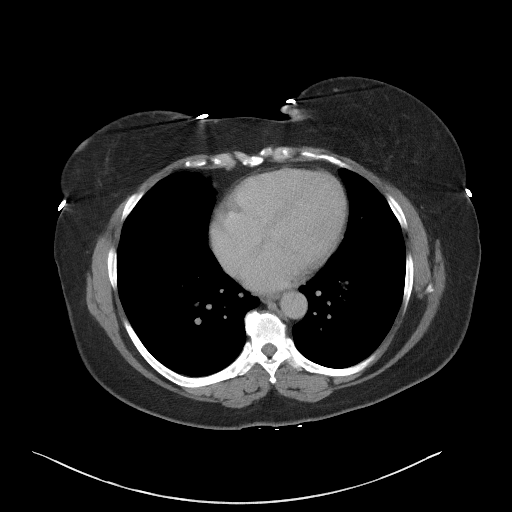

[Series 5: coronal st · coronal · 0.98mm/px · 3 of 117 slices shown]
[im 39/117  soft-tissue]
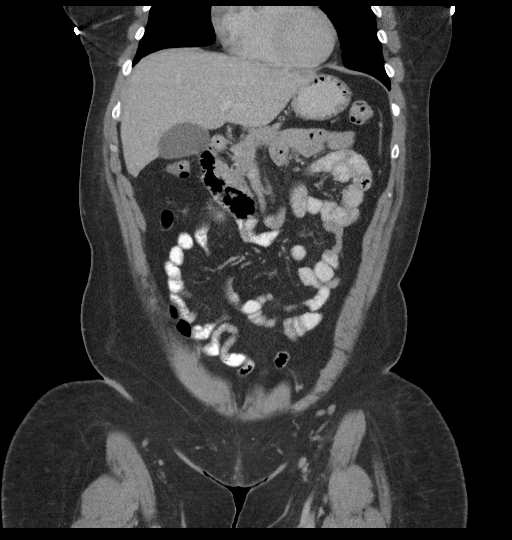
[im 52/117  soft-tissue]
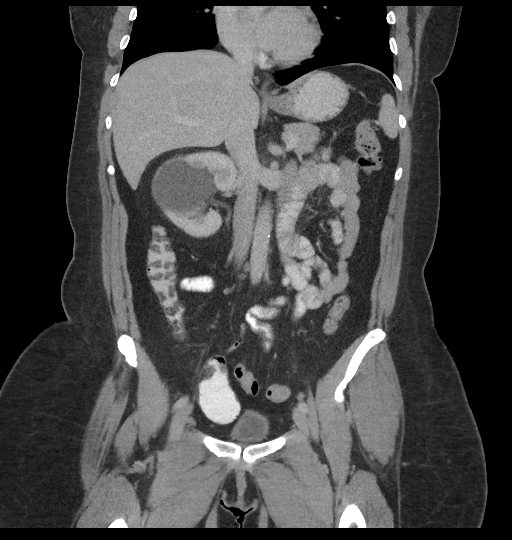
[im 65/117  soft-tissue]
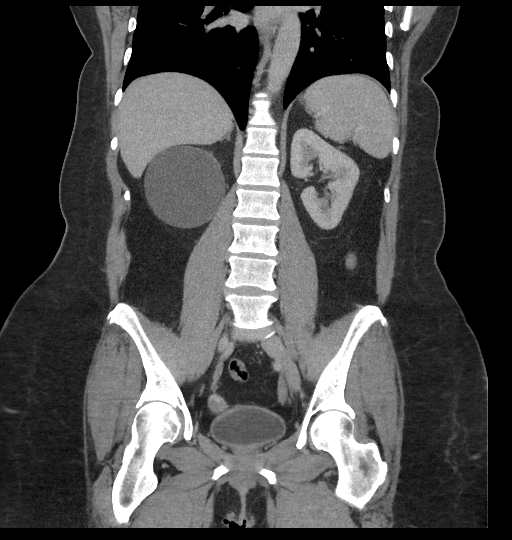

[17 of 46 positions shown; findings below may reference images not displayed]

FINDINGS: Lower chest: No acute abnormality. 3 mm triangular nodule in the
right lower lobe along the right major fissure on image 7 of series
4. This probably represents a benign intrapulmonary node. Heart size
is normal. No effusions.

Hepatobiliary: Multiple benign-appearing small hepatic cysts, the
largest in the posterior aspect of the left lobe measuring 22 mm.
The parenchyma is otherwise normal. Biliary tree is normal.

Pancreas: Normal.

Spleen: Normal.

Adrenals/Urinary Tract: Normal adrenal glands. 9 cm cyst in the
posterior aspect of the mid right kidney. 13 mm cyst in the upper
pole of the left kidney. Kidneys are otherwise normal. Bladder is
normal.

Stomach/Bowel: Normal including the terminal ileum and appendix.

Vascular/Lymphatic: There are few calcifications in the abdominal
aorta. No adenopathy.

Reproductive: Uterus has been removed.  Ovaries are normal.

Other: No free air or free fluid.

Musculoskeletal: No acute abnormality. Slight arthritis of the hips.
Moderate facet arthritis at multiple levels of the lumbar spine.
Degeneration of the L3-4 and L5-S1 discs.
IMPRESSION: No acute abnormality.

Benign renal and hepatic cysts.

Aortic atherosclerosis.

## 2018-03-04 ENCOUNTER — Emergency Department: Payer: PRIVATE HEALTH INSURANCE

## 2018-03-04 ENCOUNTER — Other Ambulatory Visit: Payer: Self-pay

## 2018-03-04 ENCOUNTER — Emergency Department
Admission: EM | Admit: 2018-03-04 | Discharge: 2018-03-04 | Disposition: A | Discharge status: Home / Self Care | Payer: PRIVATE HEALTH INSURANCE | Attending: Emergency Medicine | Admitting: Emergency Medicine

## 2018-03-04 ENCOUNTER — Encounter: Payer: Self-pay | Admitting: *Deleted

## 2018-03-04 DIAGNOSIS — Z5321 Procedure and treatment not carried out due to patient leaving prior to being seen by health care provider: Secondary | ICD-10-CM | POA: Diagnosis not present

## 2018-03-04 DIAGNOSIS — R6884 Jaw pain: Secondary | ICD-10-CM | POA: Insufficient documentation

## 2018-03-04 DIAGNOSIS — R079 Chest pain, unspecified: Secondary | ICD-10-CM | POA: Insufficient documentation

## 2018-03-04 LAB — BASIC METABOLIC PANEL
Anion gap: 8 (ref 5–15)
BUN: 16 mg/dL (ref 6–20)
CALCIUM: 9.8 mg/dL (ref 8.9–10.3)
CO2: 27 mmol/L (ref 22–32)
CREATININE: 0.88 mg/dL (ref 0.44–1.00)
Chloride: 103 mmol/L (ref 101–111)
GFR calc non Af Amer: >60 mL/min (ref >60)
Glucose, Bld: 110 mg/dL — ABNORMAL HIGH (ref 65–99)
Potassium: 3.7 mmol/L (ref 3.5–5.1)
SODIUM: 138 mmol/L (ref 135–145)

## 2018-03-04 LAB — TROPONIN I: Troponin I: <0.03 ng/mL (ref <0.03)

## 2018-03-04 LAB — CBC
HEMATOCRIT: 53.5 % — AB (ref 35.0–47.0)
HEMOGLOBIN: 17 g/dL — AB (ref 12.0–16.0)
MCH: 24.8 pg — AB (ref 26.0–34.0)
MCHC: 31.8 g/dL — AB (ref 32.0–36.0)
MCV: 77.9 fL — AB (ref 80.0–100.0)
Platelets: 1669 10*3/uL (ref 150–440)
RBC: 6.86 MIL/uL — ABNORMAL HIGH (ref 3.80–5.20)
RDW: 17.2 % — ABNORMAL HIGH (ref 11.5–14.5)
WBC: 15.1 10*3/uL — ABNORMAL HIGH (ref 3.6–11.0)

## 2018-03-04 NOTE — ED Triage Notes (Signed)
Pt presents w/ c/o sudden onset of jaw pain. Pt c/o chest pain that radiated to L arm. Pt characterizes chest pain is stabbing. Pt drives a bus for a living. Pt took ASA 325 mg tonight after arriving home. Pt also took a bayer medication. Pt presently c/o lower lip numbness and intermittent chest pain.

## 2018-03-04 NOTE — ED Notes (Addendum)
Pt states she is wanting to leave. She is aware of her elevated platelet level. Pt states it is elevated at baseline. Denies chest pain currently. Pt states she is going to take her platelet medication when she gets home which she typically does not take. Pt reports she will call her pcp in the morning and notify her of lab values. Pt states she will return if her symptoms return.

## 2018-03-04 NOTE — ED Notes (Signed)
Pt seen leaving lobby with her ride home.

## 2018-03-04 NOTE — ED Triage Notes (Signed)
FIRST NURSE NOTE-difficulty breathing with CP per EMS. Normal EKG with EMS. bp 118/80.  Unlabored.  VSS with EMS. Hx thrombocytosis.

## 2018-03-05 ENCOUNTER — Telehealth: Payer: Self-pay | Admitting: Emergency Medicine

## 2018-03-05 NOTE — Telephone Encounter (Signed)
Called patient due to lwot to inquire about condition and follow up plans. Says she has no  Pain and has already called her doctor.

## 2018-10-12 IMAGING — CR DG CHEST 2V
2 series · 2 of 2 positions shown · non-contrast
Comparison: 04/08/2014

CLINICAL DATA: Sudden onset of jaw pain.

EXAM:
CHEST - 2 VIEW

[chest pa]
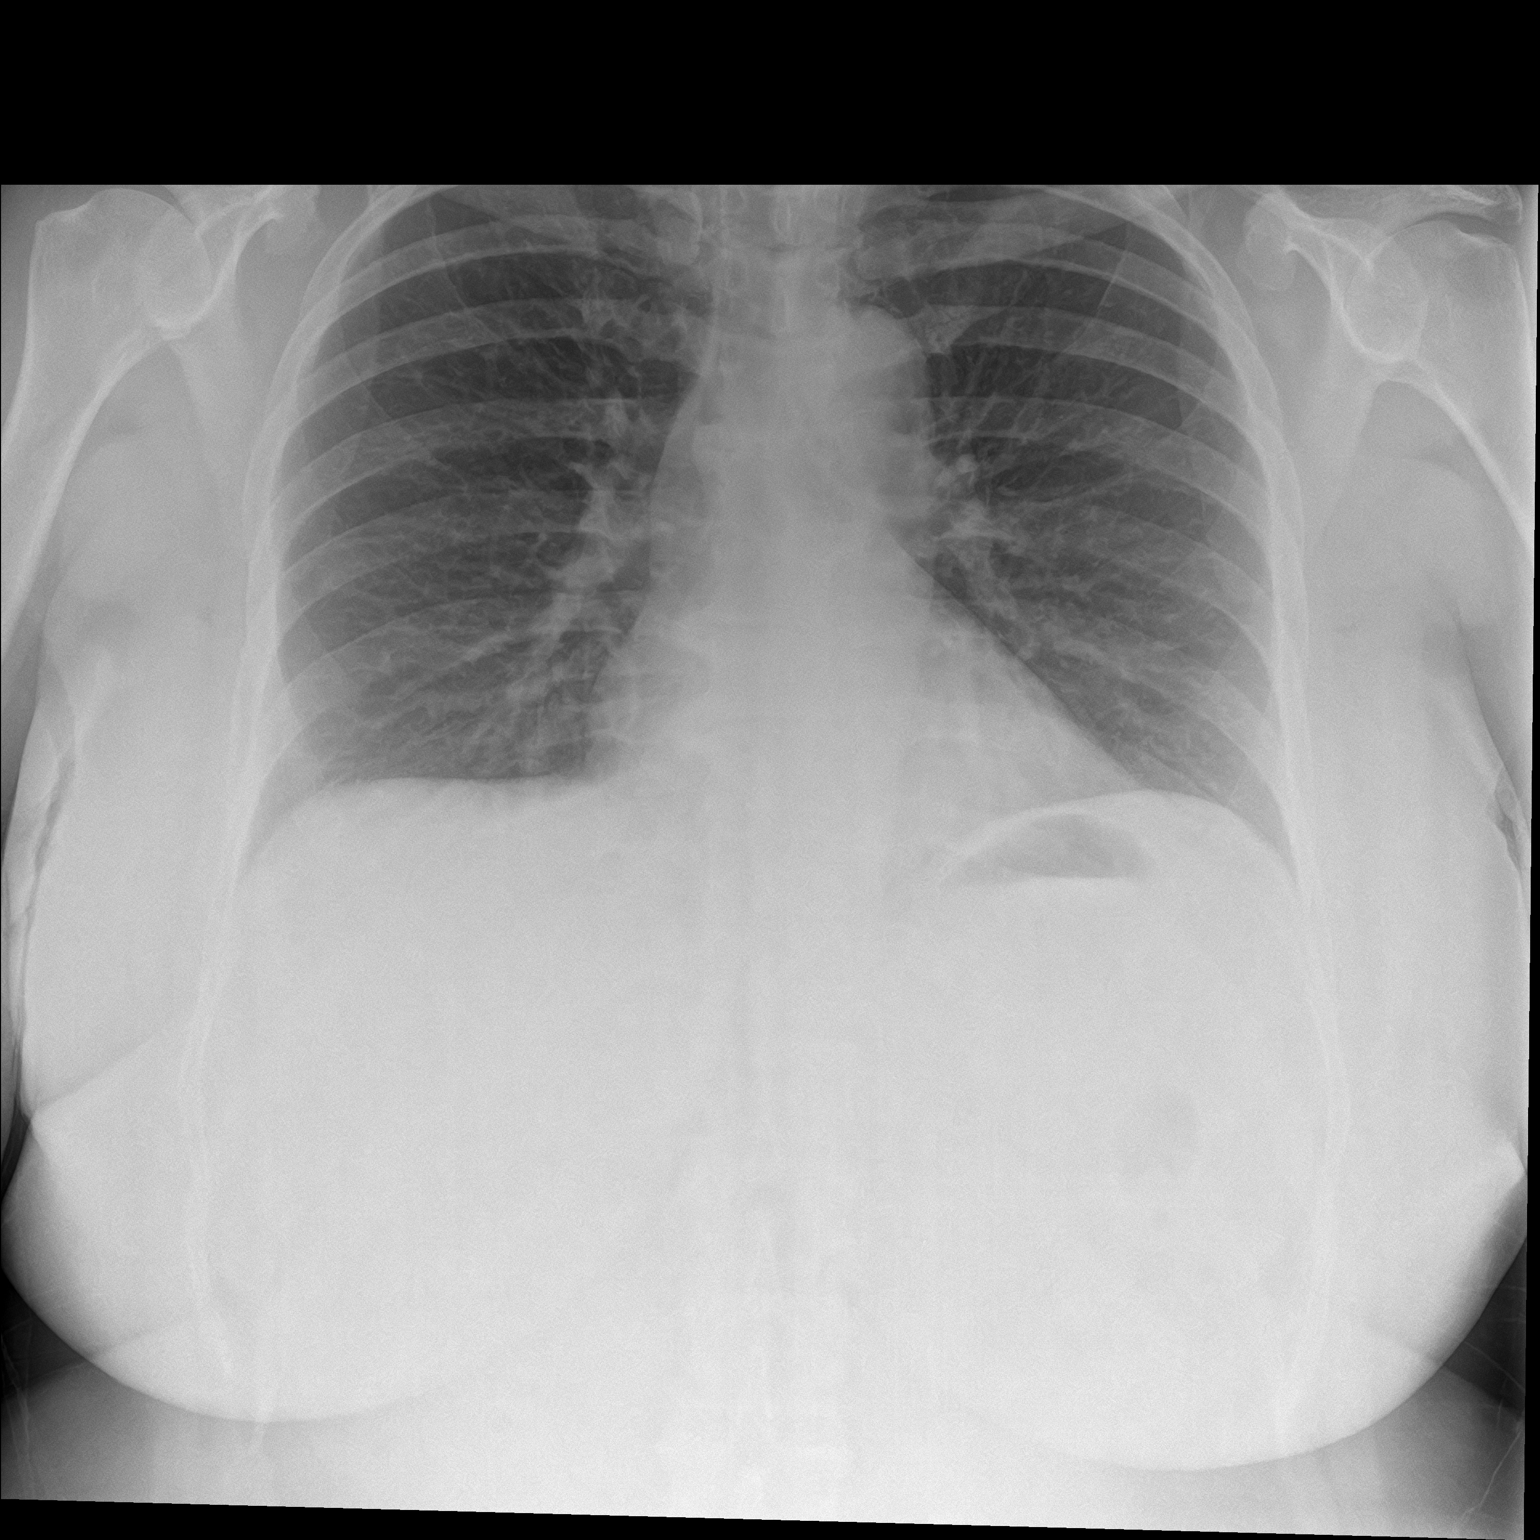

[chest lat]
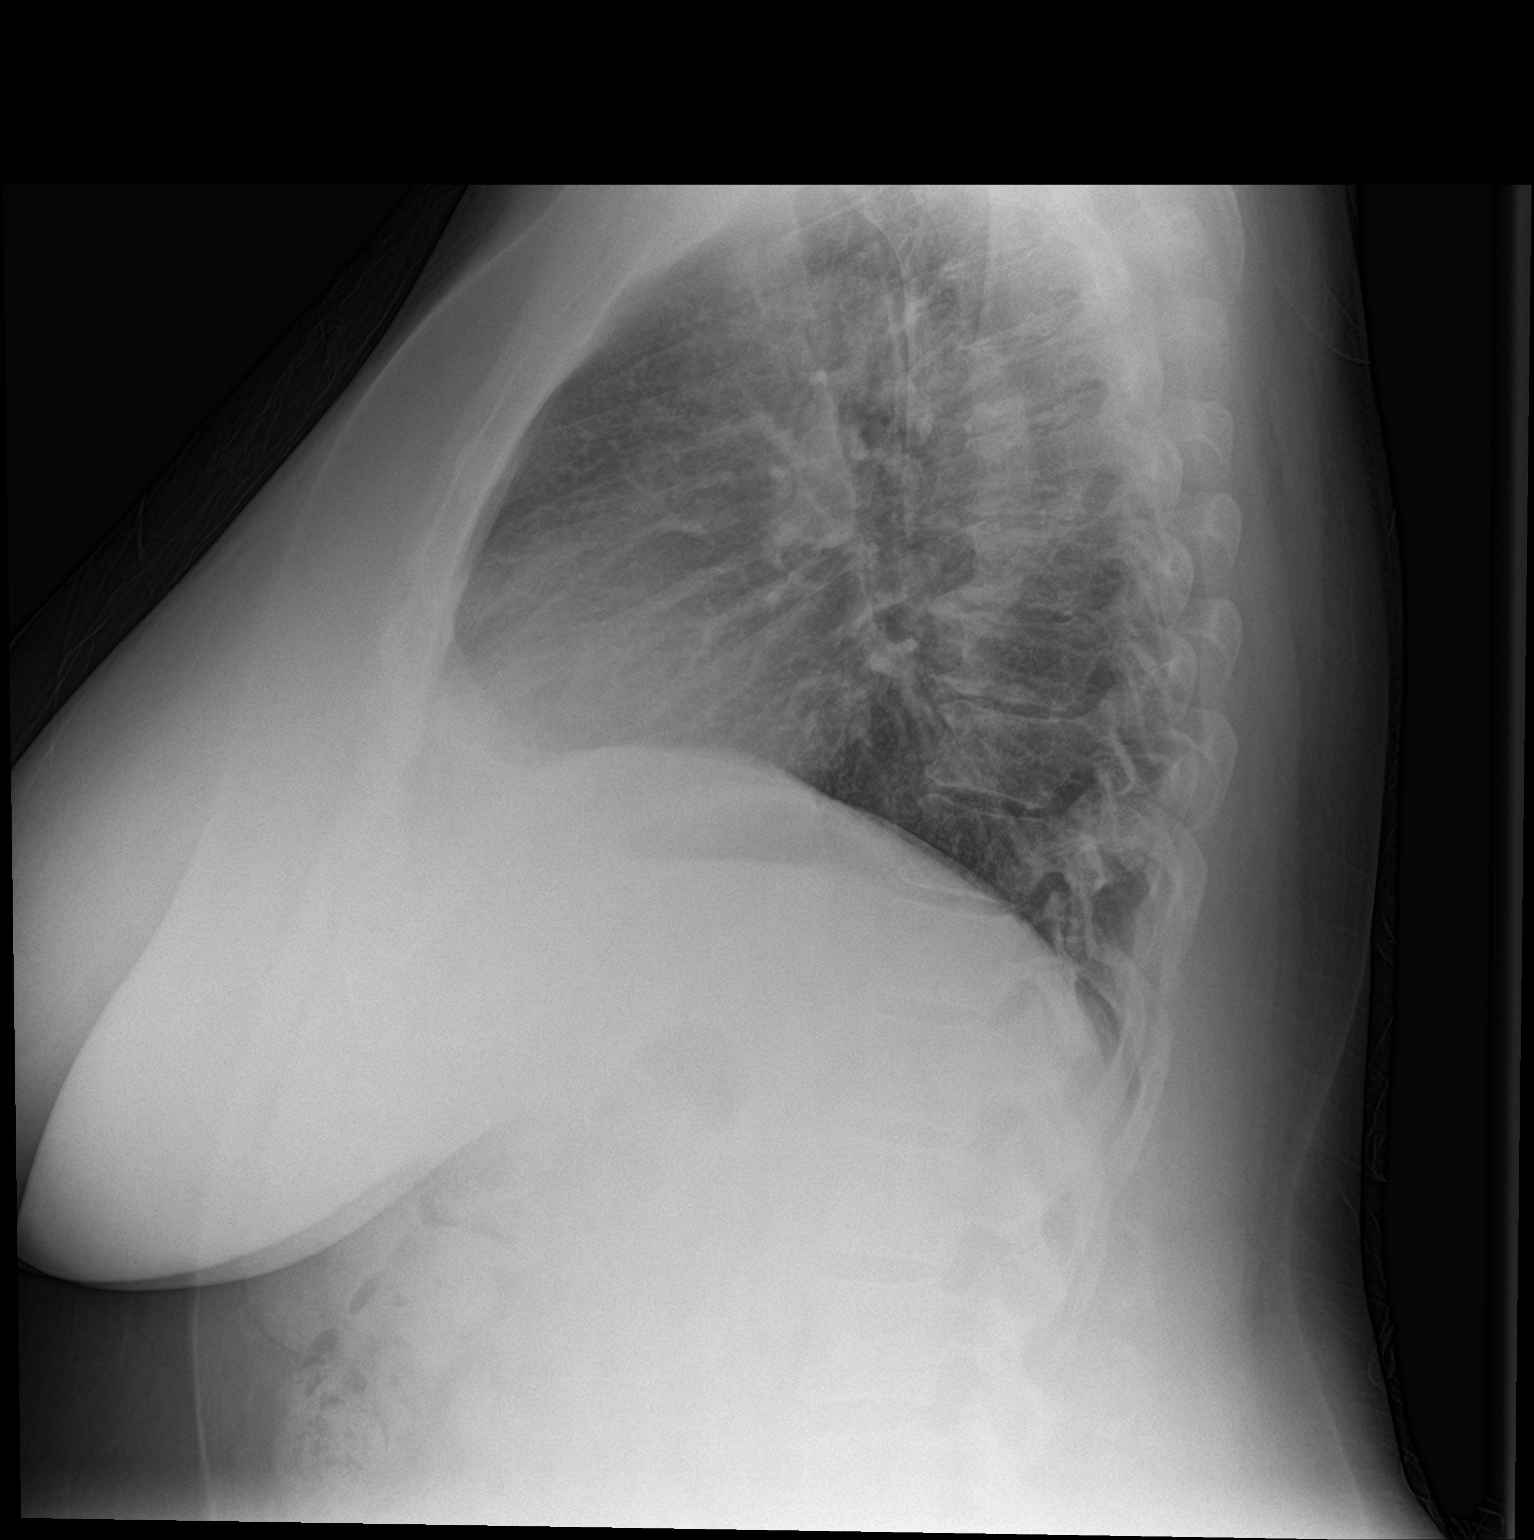

[2 of 2 positions shown; findings below may reference images not displayed]

FINDINGS: The heart size and mediastinal contours are within normal limits.
Both lungs are clear. Spondylosis noted within the thoracic spine.
IMPRESSION: 1. No active cardiopulmonary abnormalities.

## 2019-01-01 ENCOUNTER — Ambulatory Visit: Admit: 2019-01-01 | Discharge: 2019-01-01 | Payer: MEDICARE

## 2019-02-21 ENCOUNTER — Ambulatory Visit: Admit: 2019-02-21 | Discharge: 2019-02-22 | Payer: MEDICARE

## 2019-02-25 ENCOUNTER — Ambulatory Visit: Admit: 2019-02-25 | Discharge: 2019-02-26 | Payer: MEDICARE

## 2019-02-25 DIAGNOSIS — M25562 Pain in left knee: Principal | ICD-10-CM

## 2019-02-25 DIAGNOSIS — S8002XA Contusion of left knee, initial encounter: Principal | ICD-10-CM

## 2019-02-26 MED ORDER — PREDNISONE 10 MG TABLET
ORAL_TABLET | 0 refills | 0 days | Status: CP
Start: 2019-02-26 — End: 2019-03-25

## 2019-03-24 ENCOUNTER — Ambulatory Visit: Admit: 2019-03-24 | Discharge: 2019-03-25 | Payer: MEDICARE

## 2019-03-24 DIAGNOSIS — D473 Essential (hemorrhagic) thrombocythemia: Principal | ICD-10-CM

## 2019-03-25 DIAGNOSIS — D473 Essential (hemorrhagic) thrombocythemia: Principal | ICD-10-CM

## 2019-03-25 DIAGNOSIS — D45 Polycythemia vera: Secondary | ICD-10-CM

## 2019-03-25 MED ORDER — HYDROXYUREA 500 MG CAPSULE
ORAL_CAPSULE | Freq: Every day | ORAL | 12 refills | 0 days
Start: 2019-03-25 — End: 2019-05-06

## 2019-03-28 ENCOUNTER — Ambulatory Visit: Admit: 2019-03-28 | Discharge: 2019-03-29 | Payer: MEDICARE

## 2019-03-28 DIAGNOSIS — D45 Polycythemia vera: Principal | ICD-10-CM

## 2019-04-07 ENCOUNTER — Ambulatory Visit: Admit: 2019-04-07 | Discharge: 2019-04-07 | Payer: MEDICARE

## 2019-04-07 DIAGNOSIS — D473 Essential (hemorrhagic) thrombocythemia: Principal | ICD-10-CM

## 2019-04-07 DIAGNOSIS — D45 Polycythemia vera: Principal | ICD-10-CM

## 2019-04-17 ENCOUNTER — Ambulatory Visit: Admit: 2019-04-17 | Discharge: 2019-04-17 | Payer: MEDICARE

## 2019-04-17 DIAGNOSIS — D45 Polycythemia vera: Principal | ICD-10-CM

## 2019-04-17 DIAGNOSIS — D473 Essential (hemorrhagic) thrombocythemia: Principal | ICD-10-CM

## 2019-04-24 ENCOUNTER — Ambulatory Visit: Admit: 2019-04-24 | Discharge: 2019-04-24 | Payer: MEDICARE

## 2019-04-24 DIAGNOSIS — D45 Polycythemia vera: Principal | ICD-10-CM

## 2019-04-24 DIAGNOSIS — D473 Essential (hemorrhagic) thrombocythemia: Principal | ICD-10-CM

## 2019-05-06 ENCOUNTER — Telehealth: Admit: 2019-05-06 | Discharge: 2019-05-07 | Payer: MEDICARE

## 2019-05-06 DIAGNOSIS — D45 Polycythemia vera: Principal | ICD-10-CM

## 2019-05-06 MED ORDER — HYDROXYUREA 500 MG CAPSULE
ORAL_CAPSULE | Freq: Every day | ORAL | 12 refills | 0 days
Start: 2019-05-06 — End: ?

## 2019-05-07 ENCOUNTER — Ambulatory Visit: Admit: 2019-05-07 | Discharge: 2019-05-08 | Payer: MEDICARE

## 2019-05-07 DIAGNOSIS — D45 Polycythemia vera: Principal | ICD-10-CM

## 2019-05-07 DIAGNOSIS — M25562 Pain in left knee: Principal | ICD-10-CM

## 2019-05-07 DIAGNOSIS — M5431 Sciatica, right side: Secondary | ICD-10-CM

## 2019-05-07 DIAGNOSIS — M7541 Impingement syndrome of right shoulder: Secondary | ICD-10-CM

## 2019-05-12 ENCOUNTER — Ambulatory Visit: Admit: 2019-05-12 | Discharge: 2019-05-13 | Payer: MEDICARE

## 2019-05-12 DIAGNOSIS — M25562 Pain in left knee: Principal | ICD-10-CM

## 2019-05-14 ENCOUNTER — Ambulatory Visit: Admit: 2019-05-14 | Discharge: 2019-05-15 | Payer: MEDICARE

## 2019-05-14 DIAGNOSIS — D45 Polycythemia vera: Principal | ICD-10-CM

## 2019-05-20 ENCOUNTER — Ambulatory Visit: Admit: 2019-05-20 | Discharge: 2019-05-20 | Payer: MEDICARE

## 2019-05-20 ENCOUNTER — Ambulatory Visit
Admit: 2019-05-20 | Discharge: 2019-05-20 | Payer: MEDICARE | Attending: Emergency Medicine | Primary: Emergency Medicine

## 2019-05-20 DIAGNOSIS — D45 Polycythemia vera: Principal | ICD-10-CM

## 2019-05-20 DIAGNOSIS — M7551 Bursitis of right shoulder: Secondary | ICD-10-CM

## 2019-05-20 DIAGNOSIS — M25511 Pain in right shoulder: Secondary | ICD-10-CM

## 2019-05-20 DIAGNOSIS — M7042 Prepatellar bursitis, left knee: Principal | ICD-10-CM

## 2019-05-20 DIAGNOSIS — M1712 Unilateral primary osteoarthritis, left knee: Secondary | ICD-10-CM

## 2019-05-20 DIAGNOSIS — S83242A Other tear of medial meniscus, current injury, left knee, initial encounter: Secondary | ICD-10-CM

## 2019-05-27 ENCOUNTER — Ambulatory Visit: Admit: 2019-05-27 | Discharge: 2019-05-28 | Payer: MEDICARE

## 2019-05-27 DIAGNOSIS — M1712 Unilateral primary osteoarthritis, left knee: Secondary | ICD-10-CM

## 2019-05-27 DIAGNOSIS — S83242A Other tear of medial meniscus, current injury, left knee, initial encounter: Principal | ICD-10-CM

## 2019-07-16 ENCOUNTER — Ambulatory Visit: Admit: 2019-07-16 | Discharge: 2019-07-17 | Payer: MEDICARE

## 2019-07-16 DIAGNOSIS — D45 Polycythemia vera: Principal | ICD-10-CM

## 2019-07-31 ENCOUNTER — Ambulatory Visit: Admit: 2019-07-31 | Discharge: 2019-08-01 | Payer: MEDICARE

## 2019-07-31 DIAGNOSIS — E669 Obesity, unspecified: Secondary | ICD-10-CM

## 2019-07-31 DIAGNOSIS — M1712 Unilateral primary osteoarthritis, left knee: Secondary | ICD-10-CM

## 2019-07-31 MED ORDER — MELOXICAM 7.5 MG TABLET
ORAL_TABLET | Freq: Every day | ORAL | 2 refills | 15 days | Status: CP
Start: 2019-07-31 — End: 2020-07-30

## 2019-09-02 ENCOUNTER — Ambulatory Visit
Admit: 2019-09-02 | Discharge: 2019-09-03 | Payer: MEDICARE | Attending: Physical Medicine & Rehabilitation | Primary: Physical Medicine & Rehabilitation

## 2019-09-02 DIAGNOSIS — M25551 Pain in right hip: Secondary | ICD-10-CM

## 2019-09-02 DIAGNOSIS — G9589 Other specified diseases of spinal cord: Secondary | ICD-10-CM

## 2019-09-02 DIAGNOSIS — M5136 Other intervertebral disc degeneration, lumbar region: Secondary | ICD-10-CM

## 2019-09-02 DIAGNOSIS — M5431 Sciatica, right side: Secondary | ICD-10-CM

## 2019-09-02 DIAGNOSIS — Z9889 Other specified postprocedural states: Secondary | ICD-10-CM

## 2019-09-09 ENCOUNTER — Ambulatory Visit
Admit: 2019-09-09 | Discharge: 2019-10-08 | Payer: MEDICARE | Attending: Rehabilitative and Restorative Service Providers" | Primary: Rehabilitative and Restorative Service Providers"

## 2019-09-09 ENCOUNTER — Ambulatory Visit: Admit: 2019-09-09 | Discharge: 2019-09-10 | Payer: MEDICARE

## 2019-09-25 ENCOUNTER — Ambulatory Visit: Admit: 2019-09-25 | Discharge: 2019-09-26 | Payer: MEDICARE

## 2019-09-25 DIAGNOSIS — M1712 Unilateral primary osteoarthritis, left knee: Principal | ICD-10-CM

## 2019-09-25 DIAGNOSIS — E669 Obesity, unspecified: Principal | ICD-10-CM

## 2019-09-25 DIAGNOSIS — Z6841 Body Mass Index (BMI) 40.0 and over, adult: Secondary | ICD-10-CM

## 2019-10-10 ENCOUNTER — Telehealth: Admit: 2019-10-10 | Discharge: 2019-10-11 | Payer: MEDICARE | Attending: Registered" | Primary: Registered"

## 2019-12-12 ENCOUNTER — Telehealth: Admit: 2019-12-12 | Discharge: 2019-12-12 | Payer: MEDICARE | Attending: Registered" | Primary: Registered"

## 2020-03-24 ENCOUNTER — Ambulatory Visit: Admit: 2020-03-24 | Discharge: 2020-03-25 | Payer: MEDICARE

## 2020-03-24 DIAGNOSIS — D45 Polycythemia vera: Principal | ICD-10-CM

## 2020-03-30 ENCOUNTER — Ambulatory Visit: Admit: 2020-03-30 | Discharge: 2020-03-30 | Payer: MEDICARE

## 2020-03-30 DIAGNOSIS — D45 Polycythemia vera: Principal | ICD-10-CM

## 2020-04-08 ENCOUNTER — Ambulatory Visit: Admit: 2020-04-08 | Discharge: 2020-04-09 | Payer: MEDICARE

## 2020-04-08 DIAGNOSIS — D45 Polycythemia vera: Principal | ICD-10-CM

## 2020-04-14 ENCOUNTER — Ambulatory Visit: Admit: 2020-04-14 | Discharge: 2020-04-15 | Payer: MEDICARE

## 2020-04-14 DIAGNOSIS — D45 Polycythemia vera: Principal | ICD-10-CM

## 2020-04-30 DIAGNOSIS — D45 Polycythemia vera: Principal | ICD-10-CM

## 2020-04-30 MED ORDER — HYDROXYUREA 500 MG CAPSULE
ORAL_CAPSULE | Freq: Every day | ORAL | 12 refills | 30 days | Status: CP
Start: 2020-04-30 — End: ?

## 2020-05-02 MED ORDER — HYDROXYUREA 500 MG CAPSULE
ORAL_CAPSULE | Freq: Every day | ORAL | 12 refills | 30.00000 days | Status: CP
Start: 2020-05-02 — End: ?

## 2020-05-11 ENCOUNTER — Ambulatory Visit: Admit: 2020-05-11 | Discharge: 2020-05-11 | Payer: MEDICARE

## 2020-05-11 DIAGNOSIS — D45 Polycythemia vera: Principal | ICD-10-CM

## 2020-06-11 ENCOUNTER — Ambulatory Visit: Admit: 2020-06-11 | Discharge: 2020-06-12 | Payer: MEDICARE

## 2020-06-11 DIAGNOSIS — D45 Polycythemia vera: Principal | ICD-10-CM

## 2020-06-30 ENCOUNTER — Ambulatory Visit: Admit: 2020-06-30 | Discharge: 2020-07-01 | Payer: MEDICARE

## 2020-06-30 DIAGNOSIS — D45 Polycythemia vera: Principal | ICD-10-CM

## 2020-07-06 ENCOUNTER — Ambulatory Visit: Admit: 2020-07-06 | Discharge: 2020-07-07 | Payer: MEDICARE

## 2020-07-06 DIAGNOSIS — D45 Polycythemia vera: Principal | ICD-10-CM

## 2020-07-06 DIAGNOSIS — D471 Chronic myeloproliferative disease: Principal | ICD-10-CM

## 2020-07-06 DIAGNOSIS — D473 Essential (hemorrhagic) thrombocythemia: Principal | ICD-10-CM

## 2020-08-31 ENCOUNTER — Ambulatory Visit: Admit: 2020-08-31 | Payer: MEDICARE

## 2021-01-25 ENCOUNTER — Ambulatory Visit: Admit: 2021-01-25 | Discharge: 2021-01-26

## 2021-01-25 DIAGNOSIS — D45 Polycythemia vera: Principal | ICD-10-CM

## 2021-01-25 DIAGNOSIS — E669 Obesity, unspecified: Principal | ICD-10-CM

## 2021-01-25 MED ORDER — HYDROXYUREA 500 MG CAPSULE
ORAL_CAPSULE | Freq: Every day | ORAL | 0 refills | 30 days | Status: CP
Start: 2021-01-25 — End: 2022-01-25
  Filled 2021-01-25: qty 120, 30d supply, fill #0

## 2021-02-10 ENCOUNTER — Ambulatory Visit: Admit: 2021-02-10 | Payer: MEDICARE

## 2021-03-09 ENCOUNTER — Ambulatory Visit: Admit: 2021-03-09 | Discharge: 2021-03-09

## 2021-03-09 DIAGNOSIS — D45 Polycythemia vera: Principal | ICD-10-CM

## 2021-03-10 ENCOUNTER — Ambulatory Visit: Admit: 2021-03-10 | Payer: MEDICARE

## 2021-04-14 ENCOUNTER — Encounter: Admit: 2021-04-14 | Discharge: 2021-04-15 | Payer: MEDICARE

## 2021-04-14 DIAGNOSIS — D45 Polycythemia vera: Principal | ICD-10-CM

## 2021-05-02 ENCOUNTER — Ambulatory Visit: Admit: 2021-05-02 | Payer: BLUE CROSS/BLUE SHIELD

## 2021-07-04 ENCOUNTER — Ambulatory Visit: Admit: 2021-07-04 | Discharge: 2021-07-05

## 2021-07-04 DIAGNOSIS — D45 Polycythemia vera: Principal | ICD-10-CM

## 2021-07-05 ENCOUNTER — Ambulatory Visit: Admit: 2021-07-05 | Discharge: 2021-07-05

## 2021-07-05 DIAGNOSIS — D45 Polycythemia vera: Principal | ICD-10-CM

## 2021-07-05 DIAGNOSIS — D473 Essential (hemorrhagic) thrombocythemia: Principal | ICD-10-CM

## 2021-07-05 MED ORDER — HYDROXYUREA 500 MG CAPSULE
ORAL_CAPSULE | Freq: Every day | ORAL | 12 refills | 30 days | Status: CP
Start: 2021-07-05 — End: ?

## 2021-07-07 DIAGNOSIS — D45 Polycythemia vera: Principal | ICD-10-CM

## 2021-07-07 MED ORDER — HYDROXYUREA 500 MG CAPSULE
ORAL_CAPSULE | Freq: Every day | ORAL | 12 refills | 30.00000 days | Status: CP
Start: 2021-07-07 — End: ?
  Filled 2021-07-22: qty 120, 30d supply, fill #0

## 2021-07-20 ENCOUNTER — Ambulatory Visit: Admit: 2021-07-20

## 2021-07-22 ENCOUNTER — Ambulatory Visit: Admit: 2021-07-22 | Discharge: 2021-07-23

## 2021-07-22 DIAGNOSIS — D45 Polycythemia vera: Principal | ICD-10-CM

## 2021-07-27 ENCOUNTER — Ambulatory Visit: Admit: 2021-07-27

## 2021-08-02 ENCOUNTER — Ambulatory Visit: Admit: 2021-08-02 | Discharge: 2021-08-03

## 2021-08-02 DIAGNOSIS — D45 Polycythemia vera: Principal | ICD-10-CM

## 2021-11-15 DIAGNOSIS — D45 Polycythemia vera: Principal | ICD-10-CM

## 2021-11-15 MED ORDER — HYDROXYUREA 500 MG CAPSULE
ORAL_CAPSULE | Freq: Every day | ORAL | 0 refills | 30.00000 days | Status: CP
Start: 2021-11-15 — End: ?

## 2021-11-22 ENCOUNTER — Ambulatory Visit: Admit: 2021-11-22 | Discharge: 2021-11-23 | Payer: MEDICARE

## 2021-11-22 DIAGNOSIS — D45 Polycythemia vera: Principal | ICD-10-CM

## 2021-11-22 DIAGNOSIS — D473 Essential (hemorrhagic) thrombocythemia: Principal | ICD-10-CM

## 2021-12-20 ENCOUNTER — Ambulatory Visit: Admit: 2021-12-20 | Discharge: 2021-12-20 | Payer: MEDICARE

## 2021-12-20 DIAGNOSIS — D45 Polycythemia vera: Principal | ICD-10-CM

## 2022-01-25 ENCOUNTER — Ambulatory Visit: Admit: 2022-01-25 | Discharge: 2022-01-26 | Payer: MEDICARE

## 2022-02-17 ENCOUNTER — Ambulatory Visit: Admit: 2022-02-17 | Discharge: 2022-02-18 | Payer: MEDICARE

## 2022-02-17 DIAGNOSIS — D45 Polycythemia vera: Principal | ICD-10-CM

## 2022-02-17 MED ORDER — HYDROXYUREA 500 MG CAPSULE
ORAL_CAPSULE | Freq: Every day | ORAL | 0 refills | 30 days | Status: CP
Start: 2022-02-17 — End: ?

## 2022-02-28 ENCOUNTER — Ambulatory Visit: Admit: 2022-02-28 | Payer: MEDICARE

## 2022-03-21 ENCOUNTER — Ambulatory Visit: Admit: 2022-03-21 | Payer: MEDICAID

## 2022-05-02 ENCOUNTER — Ambulatory Visit: Admit: 2022-05-02 | Payer: PRIVATE HEALTH INSURANCE

## 2022-05-09 DIAGNOSIS — D45 Polycythemia vera: Principal | ICD-10-CM

## 2022-05-09 MED ORDER — HYDROXYUREA 500 MG CAPSULE
ORAL_CAPSULE | Freq: Every day | ORAL | 0 refills | 30 days | Status: CP
Start: 2022-05-09 — End: ?

## 2022-05-29 ENCOUNTER — Ambulatory Visit: Admit: 2022-05-29 | Payer: PRIVATE HEALTH INSURANCE

## 2022-06-27 ENCOUNTER — Ambulatory Visit: Admit: 2022-06-27 | Discharge: 2022-06-28

## 2022-06-27 DIAGNOSIS — D45 Polycythemia vera: Principal | ICD-10-CM

## 2022-06-27 DIAGNOSIS — Z5181 Encounter for therapeutic drug level monitoring: Principal | ICD-10-CM

## 2022-07-28 DIAGNOSIS — D45 Polycythemia vera: Principal | ICD-10-CM

## 2022-07-28 MED ORDER — ROPEGINTERFERON ALFA-2B-NJFT 500 MCG/ML SUBCUTANEOUS SYRINGE
11 refills | 0 days | Status: CP
Start: 2022-07-28 — End: ?

## 2022-08-01 DIAGNOSIS — D45 Polycythemia vera: Principal | ICD-10-CM

## 2022-08-14 NOTE — Unmapped (Unsigned)
Specialty Medication(s): Besremi    Ms.Colli has been dis-enrolled from the [facility] specialty pharmacy services due to enrollment in a manufacturer assistance program that sends medicine directly to the patient.    Additional information provided to the patient: Pt was enrolled into insurance in fraudulent manner     Annita Ratliff Vangie Bicker, PharmD  [facility] Specialty Pharmacist clean work surface for 15-30 minutes to allow it to come to room temperature  While waiting for medication to come to room temperature gather needed supplies: Alcohol swabs, sharps container, paper towel and cotton balls/bandage. Wash hands with soap and warm water and dry.  Open carton and remove the clear plastic tray holding Besremi prefilled syringe and needle  Remove needle package and Besremi syringe from the tray.  (Hold the prefilled syringe by the middle of the syringe body during removal)  Check that the liquid in the syringe is clear, colorless to slightly yellow in color and has no particles  Carefully open the needle package and remove the needle and set aside. Hold the prefilled syringe by the middle of the barrel and unscrew the cap (turn counter-clockwise)  Attach the needle to the prefilled syringe by firmly pushing it into the collar of the syringe and then screwing it clockwise until securely attached.  Choose injection site - either lower stomach (at least 2 inches away from belly button) or top of thighs and clean area with an alcohol swab and let it air dry  Pull the pink needle shield back and hold the syringe by the barrel.  Pull the clear needle cap straight up to remove and continue to hold the syringe with the needle pointing up  Confirm your prescribed dose and then set the dose on the syringe accordingly  Hold prefilled syringe at eye level with the needle pointing up over a paper towel or over the sink  Check that you can see the dose lines and number markings on the syringe  Pinch the end of the plunger  Slowly push up on the plunger to remove extra medicine until the top edge of the gray stopper lines up with the marking for your prescribed dose (do not save or store the extra liquid for future use)  Pinch chosen injection site, insert the needle at a 45-90 degree angle into the pinched skin then release the pinched skin. Inject the medicine by slowly pressing down on the plunger all the way until it stops  After all the liquid medicine is injected remove the needle from the skin  Carefully push the pink needle shield over the needle until it snaps into place and covers the needle to prevent needle sticks   Dispose of prefilled syringe and needle into sharps container  You may gently press a gauze or cotton ball over the injection site if needed.  Do not rub the injection site.  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    Side Effects & Monitoring Parameters   List common side effects:  Swelling (16%)  Hypertension (16%, including hypertensive crisis)  Hair loss (16%),   Excessive sweating (29%)  Itching (45%) or Skin rash (16%)  Diarrhea (33%); Nausea (28%); Abdominal pain (20%), Decreased appetite (18%)  Urinary tract infection (16%; serious urinary tract infection: 8%)  Infection (48%; serious infection: 8%) - including UTI, Upper respiratory tract, flu like symptoms, and common cold like  symptoms  Injection site reaction (26%)  Depression (?20%; severe depression: 4%)  Dizziness (22%) - use caution before driving or using heavy machinery  Fatigue (12% to 47%)  Headache (39%); dizziness (12%)  Sleep disorder (20%)  Muscle aches, pain and spasms (13% to 47%); bone pain (41%)  Eye disease (23%)  The following side effects should be reported to the provider:  Signs of an allergic reaction  Signs of depression (suicidal thoughts, emotional ups and downs, abnormal thinking, anxiety, or lack of interest in life)  Signs of thyroid problems (change in weight; feeling nervous, excitable, restless, or weak; hair thinning; depression; neck swelling; not able to focus; trouble with heat or cold; menstrual changes; shakiness; or sweating)  Signs of a pancreatitis (very bad stomach pain, very bad back pain, or very bad upset stomach or throwing up)  Signs of liver problems (dark urine, feeling tired, not hungry, upset stomach or stomach pain, light-colored stools, throwing up, or yellow skin or eyes)  Signs of kidney problems (unable to pass urine, change in how much urine is passed, blood in the urine, or a big weight gains)  Signs of high blood pressure (very bad headache or dizziness, passing out, or change in eyesight)  Weakness on 1 side of the body, trouble speaking or thinking, change in balance, drooping on one side of the face, or blurred eyesight.  Chest pain or pressure, a fast heartbeat, or an abnormal heartbeat.  Shortness of breath, a big weight gain, or swelling in the arms or legs  Very bad dizziness or passing out.  Change in look of teeth or gums.  No period or other menstrual changes.  Signs of colitis (abdominal pain, bloody diarrhea, and fever)    Monitoring Parameters:  CBC - baseline and every 2 weeks during dose titration and every 3-6 months during maintenance  Serum Creatinine at baseline and during therapy as needed  Serum triglycerides at baseline and throughout as needed  Amylase and lipase in patients with symptoms of pancreatitis.   Pregnancy status prior to therapy initiation if applicable.   Monitor for symptoms of psychiatric disorders   Thyroid function if symptoms suggestive of thyroid disease occur.   Ophthalmologic examination baseline and during therapy, specifically in patients with a retinopathy-associated disease (e.g. diabetes mellitus or hypertension)  Cardiovascular toxicity in patients with a history of cardiovascular disease - EKG baseline and during  Dental examinations baseline and as clinically indicated.   Monitor for signs/symptoms of   infection or bleeding  hypersensitivity reactions  colitis  pancreatitis  pulmonary toxicity,   dermatologic toxicity  LFT monitoring - baseline and during treatment as needed    Contraindications, Warnings, & Precautions     Bone marrow suppression: Decreased peripheral blood cell counts (thrombocytopenia and leukopenia and anemia) have occurred with use of interferon alfa products  Cardiovascular toxicity: may include cardiomyopathy, myocardial infarction (MI), atrial fibrillation, and coronary artery ischemia. Avoid use in patients with severe or unstable cardiovascular disease such as uncontrolled hypertension, heart failure (? NYHA class 2), serious cardiac arrhythmia, significant coronary artery stenosis, unstable angina, or recent stroke or MI.  CNS effects: may impair the ability to drive and use machinery; patients should not drive or operate heavy machinery until they know how ropeginterferon alfa-2b affects them. If dizziness, somnolence, or hallucinations occur, patients should avoid driving or using heavy machinery.  Colitis: Serious or fatal ulcerative or hemorrhagic/ischemic colitis (some cases occurring as early as 12 weeks after therapy initiation) has been reported  in patients receiving interferon alfa products. Symptoms may include abdominal pain, bloody diarrhea, and fever; colitis may resolve within 1 to 3 weeks after treatment discontinuation.  Dental/Periodontal disorders: toxicities may occur in patients receiving interferon alfa products. Dental and periodontal disorders may result in tooth loss. Dry mouth may damage teeth and oral mucous membranes during long-term ropeginterferon alfa-2b therapy.  Dermatologic toxicity: may include skin rash, pruritus, alopecia, erythema, psoriasis, xeroderma, dermatitis acneiform, hyperkeratosis, and hyperhidrosis.  Endocrine toxicity: may include worsening hypothyroidism and hyperthyroidism. In addition, autoimmune thyroiditis and hyperglycemia (including new onset type 1 diabetes) have been observed in patients receiving interferon alfa-2b products.  Hepatotoxicity: may include serum ALT, AST, GGT, and bilirubin elevations.   Hypersensitivity: may include serious, acute hypersensitivity reactions such as urticaria, angioedema, bronchoconstriction, and anaphylaxis.  Hyperlipidemia: Hyperlipidemia, hypertriglyceridemia, or dyslipidemia were reported in a small number of patients. Elevated triglycerides may result in pancreatitis.  Neuropsychiatric effects: Life-threatening or fatal neuropsychiatric reactions have occurred. Reactions may occur with or without a history of previous psychiatric conditions. Depression, depressive symptoms, depressed mood, and listlessness have been reported. CNS effects reported with other interferon products include suicidal ideation, attempted suicide, aggression, bipolar disorder, mania, and confusion.  Ocular toxicity: may include severe eye disorders such as retinopathy, retinal hemorrhage, retinal exudates, retinal detachment, and retinal artery or vein occlusion, which may result in blindness. Almost one-quarter of patients receiving ropeginterferon alfa-2b experienced an eye disorder, which included cataract and dry eye.  Pancreatitis: may include nausea, vomiting, upper abdominal pain, bloating, and/or fever. Elevated amylase, lipase, or WBC or altered renal or hepatic function may also occur.  Pulmonary toxicity: may present as dyspnea, pulmonary infiltrates, pneumonia, bronchiolitis obliterans, interstitial pneumonitis, pulmonary hypertension, and/or sarcoidosis. Some pulmonary toxicity events have resulted in respiratory failure or death.  Renal toxicity: A small percentage of patients developed renal impairment and toxic nephropathy during ropeginterferon alfa-2b treatment.  Patients who may become pregnant should use effective contraception during therapy and for at least 8 weeks after the last ropeginterferon alfa-2b dose.  Breastfeeding is not recommended by the manufacturer during therapy and for 8 weeks after the last ropeginterferon alfa-2b dose.  Drug/Food Interactions     Medication list reviewed in Epic. The patient was instructed to inform the care team before taking any new medications or supplements. Avoid coadministration of ropeginterferon alfa-2b and other myelosuppressive agents. If this combination cannot be avoided, monitor patients for excessive myelosuppressive effects. - doses will be adjusted by clinic.   Contains polysorbate 80    Storage, Handling Precautions, & Disposal   Store syringes in refrigerator at 2??C to 8??C (36??F to 46??F); do not freeze.   Store in the original container to protect from light.  Protect from heat.  Keep all drugs in a safe place. Keep all drugs out of the reach of children and pets.  Throw away unused or expired drugs. Do not flush down a toilet or pour down a drain unless you are told to do so. Check with your pharmacist if you have questions about the best way to throw out drugs. There may be drug take-back programs in your area.        Current Medications (including OTC/herbals), Comorbidities and Allergies     Current Outpatient Medications   Medication Sig Dispense Refill    aspirin (ECOTRIN) 81 MG tablet Take 1 tablet (81 mg total) by mouth two (2) times a day.      cholecalciferol, vitamin D3, 50 mcg (2,000 unit) cap Take 1 capsule (  50 mcg total) by mouth.      cyclobenzaprine (FLEXERIL) 10 MG tablet Take by mouth. Frequency:PRN   Dosage:0.0     Instructions:  Note:Dose: 5-10 MG      diclofenac sodium (VOLTAREN) 1 % gel Apply 2 g topically.      EPINEPHrine (EPIPEN JR) 0.15 mg/0.3 mL injection Inject 0.3 mL (0.15 mg total) into the muscle as needed for anaphylaxis.      ESTROGENS, CONJUGATED (PREMARIN ORAL) Take by mouth. Frequency:QD   Dosage:0.0     Instructions:  Note:Dose: 1      etodolac (LODINE) 400 MG tablet Take by mouth Two (2) times a day.      fexofenadine (ALLEGRA) 180 MG tablet Take 1 tablet (180 mg total) by mouth.      HYDROcodone-acetaminophen (NORCO) 5-325 mg per tablet Take 1-2 tablets by mouth every six (6) hours as needed for pain. for 40 doses 25 tablet 0    hydrocortisone acetate 1 % cream Apply 1 application. topically.      hydroxyurea (HYDREA) 500 mg capsule Take 4 capsules (2,000 mg total) by mouth daily. 120 capsule 0    hydroxyurea (HYDREA) 500 mg capsule Take 4 capsules (2,000 mg total) by mouth daily. Hydrea  2,000 mg (4 caps) daily by mouth 120 capsule 0    meclizine (ANTIVERT) 25 mg tablet Take 1 tablet (25 mg total) by mouth. Frequency:PRN   Dosage:25   MG  Instructions:  Note:Dose: 25 MG      naltrexone-bupropion 8-90 mg TbER once daily in the morning for 1 week; at week 2, increase to 1 tablet twice daily administered in the morning and evening and continue for 1 week; at week 3, increase to 2 tablets in the morning and 1 tablet in the evening and continue for 1 week; at week 4, increase to 2 tablets twice daily (Patient not taking: Reported on 07/05/2021)      omeprazole (PRILOSEC) 40 MG capsule Take 1 capsule (40 mg total) by mouth. Frequency:QD   Dosage:40   MG  Instructions:  Note:Dose: 40 MG      pantoprazole (PROTONIX) 40 MG tablet Take 1 tablet (40 mg total) by mouth daily at 0600. Frequency:QD   Dosage:40   MG  Instructions:  Note:Dose: 40MG  90 tablet 0    polyethylene glycol (GLYCOLAX) 17 gram/dose powder MIX ONE PACKET IN WATER AND DRINK ONCE DAILY      ropeginterferon alfa-2b-njft (BESREMI) 500 mcg/mL subcutaneous injection Inject under the skin every 2 weeks, start at , adjust by every 2 weeks to target blood counts, max dose . 2 mL 11    valACYclovir (VALTREX) 1000 MG tablet Take 1 tablet (1,000 mg total) by mouth.      venlafaxine HCl (VENLAFAXINE ORAL) Take 150 mg by mouth.       No current facility-administered medications for this visit.       Allergies   Allergen Reactions    Latex, Natural Rubber Anaphylaxis    Shellfish Containing Products Hives    Codeine Other (See Comments)     Nausea and Vomiting       Patient Active Problem List   Diagnosis    Essential thrombocytosis (CMS-HCC)    Tendinitis of left rotator cuff    P. vera (CMS-HCC)    Class 3 severe obesity due to excess calories with body mass index (BMI) of 40.0 to 44.9 in adult (CMS-HCC)       Reviewed and up to date in Epic.  Appropriateness of Therapy     Acute infections noted within Epic:  No active infections  Patient reported infection: {Blank single:19197::None,***- patient reported to provider,***- pharmacy reported to provider}    Is medication and dose appropriate based on diagnosis and infection status? Yes    Prescription has been clinically reviewed: Yes      Baseline Quality of Life Assessment      How many days over the past month did your condition  keep you from your normal activities? For example, brushing your teeth or getting up in the morning. {Blank:19197::***,0,Patient declined to answer}    Financial Information     Medication Assistance provided: Prior Authorization and Copay Assistance    Anticipated copay of $0 reviewed with patient. Verified delivery address.    Delivery Information     Scheduled delivery date: ***    Expected start date: ***    Medication will be delivered via Same Day Courier to the prescription address in Epic [place].  This shipment will not require a signature.      Explained the services we provide at [facility] and that each month we would call to set up refills.  Stressed importance of returning phone calls so that we could ensure they receive their medications in time each month.  Informed patient that we should be setting up refills 7-10 days prior to when they will run out of medication.  A pharmacist will reach out to perform a clinical assessment periodically.  Informed patient that a welcome packet, containing information about our pharmacy and other support services, a Notice of Privacy Practices, and a drug information handout will be sent.      The patient or caregiver noted above participated in the development of this care plan and knows that they can request review of or adjustments to the care plan at any time.      Patient or caregiver verbalized understanding of the above information as well as how to contact the pharmacy at [phone] option 4 with any questions/concerns.  The pharmacy is open Monday through Friday 8:30am-4:30pm.  A pharmacist is available 24/7 via pager to answer any clinical questions they may have.    Patient Specific Needs     Does the patient have any physical, cognitive, or cultural barriers? {Blank single:19197::No,Yes - ***}    Does the patient have adequate living arrangements? (i.e. the ability to store and take their medication appropriately) {Blank single:19197::Yes,No - ***}    Did you identify any home environmental safety or security hazards? {Blank single:19197::No,Yes - ***}    Patient prefers to have medications discussed with  Patient     Is the patient or caregiver able to read and understand education materials at a high school level or above? Yes    Patient's primary language is  English     Is the patient high risk? No    SOCIAL DETERMINANTS OF HEALTH     At the [facility], we have learned that life circumstances - like trouble affording food, housing, utilities, or transportation can affect the health of many of our patients.   That is why we wanted to ask: are you currently experiencing any life circumstances that are negatively impacting your health and/or quality of life? {YES/NO/PATIENTDECLINED:93004}    Social Determinants of Health     Financial Resource Strain: Not on file   Internet Connectivity: Not on file   Food Insecurity: Not on file   Tobacco Use: High Risk (  06/27/2022)    Patient History     Smoking Tobacco Use: Some Days     Smokeless Tobacco Use: Never     Passive Exposure: Not on file   Housing/Utilities: Not on file   Alcohol Use: Not on file   Transportation Needs: Not on file   Substance Use: Not on file   Health Literacy: Not on file   Physical Activity: Not on file   Interpersonal Safety: Not on file   Stress: Not on file   Intimate Partner Violence: Not on file   Depression: Not on file   Social Connections: Not on file       Would you be willing to receive help with any of the needs that you have identified today? {Yes/No/Not applicable:93005}       Aymara Sassi Vangie Bicker  [facility] Specialty Pharmacist

## 2022-08-14 NOTE — Unmapped (Signed)
[facility] Specialty Medication Onboarding    Specialty Medication: Besremi  Prior Authorization: Approved   Financial Assistance: Yes - copay card approved as secondary   Final Copay/Day Supply: $0 / 28    Insurance Restrictions: None     Notes to Pharmacist:     The triage team has completed the benefits investigation and has determined that the patient is able to fill this medication at [facility]. Please contact the patient to complete the onboarding or follow up with the prescribing physician as needed.

## 2022-08-24 ENCOUNTER — Ambulatory Visit: Admit: 2022-08-24 | Discharge: 2022-08-25 | Attending: Oncology | Primary: Oncology

## 2022-08-24 DIAGNOSIS — D45 Polycythemia vera: Principal | ICD-10-CM

## 2022-08-24 LAB — COMPREHENSIVE METABOLIC PANEL
ALBUMIN: 3.7 g/dL (ref 3.4–5.0)
ALKALINE PHOSPHATASE: 111 U/L (ref 46–116)
ALT (SGPT): 11 U/L (ref 10–49)
ANION GAP: 6 mmol/L (ref 5–14)
AST (SGOT): 12 U/L (ref <=34)
BILIRUBIN TOTAL: 0.3 mg/dL (ref 0.3–1.2)
BLOOD UREA NITROGEN: 15 mg/dL (ref 9–23)
BUN / CREAT RATIO: 16
CALCIUM: 9.6 mg/dL (ref 8.7–10.4)
CHLORIDE: 106 mmol/L (ref 98–107)
CO2: 28.9 mmol/L (ref 20.0–31.0)
CREATININE: 0.95 mg/dL — ABNORMAL HIGH
EGFR CKD-EPI (2021) FEMALE: 69 mL/min/{1.73_m2} (ref >=60)
GLUCOSE RANDOM: 78 mg/dL (ref 70–179)
POTASSIUM: 3.7 mmol/L (ref 3.4–4.8)
PROTEIN TOTAL: 7.1 g/dL (ref 5.7–8.2)
SODIUM: 141 mmol/L (ref 135–145)

## 2022-08-24 LAB — CBC W/ AUTO DIFF
BASOPHILS ABSOLUTE COUNT: 0.1 10*9/L (ref 0.0–0.1)
BASOPHILS RELATIVE PERCENT: 1.3 %
EOSINOPHILS ABSOLUTE COUNT: 0.2 10*9/L (ref 0.0–0.5)
EOSINOPHILS RELATIVE PERCENT: 2.9 %
HEMATOCRIT: 46 % — ABNORMAL HIGH (ref 34.0–44.0)
HEMOGLOBIN: 14.4 g/dL (ref 11.3–14.9)
LYMPHOCYTES ABSOLUTE COUNT: 2.5 10*9/L (ref 1.1–3.6)
LYMPHOCYTES RELATIVE PERCENT: 35.6 %
MEAN CORPUSCULAR HEMOGLOBIN CONC: 31.3 g/dL — ABNORMAL LOW (ref 32.0–36.0)
MEAN CORPUSCULAR HEMOGLOBIN: 24.1 pg — ABNORMAL LOW (ref 25.9–32.4)
MEAN CORPUSCULAR VOLUME: 76.8 fL — ABNORMAL LOW (ref 77.6–95.7)
MEAN PLATELET VOLUME: 6.9 fL (ref 6.8–10.7)
MONOCYTES ABSOLUTE COUNT: 0.5 10*9/L (ref 0.3–0.8)
MONOCYTES RELATIVE PERCENT: 6.8 %
NEUTROPHILS ABSOLUTE COUNT: 3.8 10*9/L (ref 1.8–7.8)
NEUTROPHILS RELATIVE PERCENT: 53.4 %
PLATELET COUNT: 1090 10*9/L (ref 150–450)
RED BLOOD CELL COUNT: 5.99 10*12/L — ABNORMAL HIGH (ref 3.95–5.13)
RED CELL DISTRIBUTION WIDTH: 18.1 % — ABNORMAL HIGH (ref 12.2–15.2)
WBC ADJUSTED: 7.1 10*9/L (ref 3.6–11.2)

## 2022-08-24 LAB — TSH: THYROID STIMULATING HORMONE: 1.082 u[IU]/mL (ref 0.550–4.780)

## 2022-08-24 NOTE — Unmapped (Signed)
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    Current hydroxyurea dose: 2000 mg once daily    Plan:  -Defer ropeginterferon start till Jan 2024  -Ropepginterferon teaching at [facility] in Jan 2024, if patient's mental health improves, starting dose  -Goal: hematocrit < 45% (but ideally 42%), anc >2.0, platelets < 1000  -Continue hydroxyurea 2000mg  daily    Counseled patient on:  -ropeginterferon plan, as above  Prescriptions:  -ropeginterferon 56mcg/mL #30mLs (11 refills)  -Prescriptions sent electronically to [facility] on 06/28/22      Katie Boyle  verbalized understanding of the treatment plan provided and had no further questions.     F/u: 10/13/22 with Dr Marcheta Grammes, with cpp on 12/21/22  ___________________________________________________________________    Patient reported following:  -not ready to start ropeginterferon, currently having severe anxiety and moderate depression due to recent loss of her father, adjusting to new job, and currently on venlafaxine  -received 2 doses of ropeginterferon from [employer], exp 03/2024  -GAD7 score 16 (moderately severe anxiety),   PHQ9 score 14  (moderate depression)    During past 2 weeks  0-not at all  1 several days  2 more than half the days  3 nearly every day     GAD7 (over last 2 weeks) 08/24/22    Feeling nervous, anxious or on edge 3    Not being able to stop or control worrying 3    Worrying too much about different things 3    Trouble relaxing 1    Being so restless that it is hard to sit still 0    Becoming easily annoyed or irritable 3    Feeling afraid as if something awful  might happen 3    If you checked off any problems, how difficult have these problems made it for you to do your work, take care of things at home, or get along with other people? GAD7 total 16    During past 2 weeks  0 - not at all  1 - several days  2 - more than half the days  3 - nearly every day     PHQ9 08/24/22    1. Little interest or pleasure in doing things 3    2. Feeling down, depressed, or hopeless 0    3. Trouble falling or staying asleep, or  sleeping too much 3    4. Feeling tired or having little energy 3    5. Poor appetite or overeating 3    6. Feeling bad about yourself -- or that you are a failure or have let yourself or your family down 0    7. Trouble concentrating on things, such as reading the newspaper or watching television 2    8. Moving or speaking so slowly that other people could have noticed? Or the opposite -- being so fidgety or restless that you have been moving around a lot more than usual 0    9. Thoughts that you would be better off dead or of hurting yourself in some way 0    If you checked off any problems, how difficult have these problems made it for you to do your work, take care of things at home, or get along with other people?     PHQ9 Total 14  Medication access:  Insurance: none, but was automatically enrolled in [place] (patient will call to cancel, was going to get insurance from employer  -Copay: 0 with copay assistance       Approximate face time spent with patient: 10 minutes     Audie Box, PharmD, BCOP, CPP  Clinical Pharmacist

## 2022-08-29 LAB — ANA: ANTINUCLEAR ANTIBODIES (ANA): POSITIVE — AB

## 2022-09-07 ENCOUNTER — Ambulatory Visit: Admit: 2022-09-07

## 2022-10-03 ENCOUNTER — Ambulatory Visit: Admit: 2022-10-03 | Payer: PRIVATE HEALTH INSURANCE

## 2022-10-31 DIAGNOSIS — D45 Polycythemia vera: Principal | ICD-10-CM

## 2022-10-31 MED ORDER — HYDROXYUREA 500 MG CAPSULE
ORAL_CAPSULE | Freq: Every day | ORAL | 0 refills | 30.00000 days
Start: 2022-10-31 — End: ?

## 2022-11-01 ENCOUNTER — Ambulatory Visit: Admit: 2022-11-01 | Discharge: 2022-11-02 | Payer: PRIVATE HEALTH INSURANCE

## 2022-11-01 DIAGNOSIS — Z5181 Encounter for therapeutic drug level monitoring: Principal | ICD-10-CM

## 2022-11-01 DIAGNOSIS — D45 Polycythemia vera: Principal | ICD-10-CM

## 2022-11-01 MED ORDER — HYDROXYUREA 500 MG CAPSULE
ORAL_CAPSULE | Freq: Every day | ORAL | 0 refills | 30.00000 days
Start: 2022-11-01 — End: ?

## 2022-11-02 DIAGNOSIS — D473 Essential (hemorrhagic) thrombocythemia: Principal | ICD-10-CM

## 2022-11-02 DIAGNOSIS — D45 Polycythemia vera: Principal | ICD-10-CM

## 2022-11-02 MED ORDER — HYDROXYUREA 500 MG CAPSULE
ORAL_CAPSULE | Freq: Every day | ORAL | 0 refills | 30 days | Status: CP
Start: 2022-11-02 — End: ?

## 2022-11-03 MED ORDER — HYDROXYUREA 500 MG CAPSULE
ORAL_CAPSULE | Freq: Every day | ORAL | 0 refills | 30.00000 days | Status: CP
Start: 2022-11-03 — End: ?

## 2023-01-16 ENCOUNTER — Ambulatory Visit: Admit: 2023-01-16 | Payer: PRIVATE HEALTH INSURANCE

## 2023-02-14 DIAGNOSIS — D45 Polycythemia vera: Principal | ICD-10-CM

## 2023-02-14 MED ORDER — HYDROXYUREA 500 MG CAPSULE
ORAL_CAPSULE | Freq: Every day | ORAL | 0 refills | 30 days | Status: CP
Start: 2023-02-14 — End: ?

## 2023-02-26 ENCOUNTER — Ambulatory Visit: Admit: 2023-02-26 | Discharge: 2023-02-26 | Payer: PRIVATE HEALTH INSURANCE

## 2023-02-26 DIAGNOSIS — D45 Polycythemia vera: Principal | ICD-10-CM

## 2023-02-26 DIAGNOSIS — D473 Essential (hemorrhagic) thrombocythemia: Principal | ICD-10-CM

## 2023-02-28 MED ORDER — HYDROXYUREA 500 MG CAPSULE
ORAL_CAPSULE | 0 refills | 0 days
Start: 2023-02-28 — End: ?

## 2023-03-12 ENCOUNTER — Ambulatory Visit: Admit: 2023-03-12 | Discharge: 2023-03-12 | Payer: PRIVATE HEALTH INSURANCE

## 2023-03-12 DIAGNOSIS — D45 Polycythemia vera: Principal | ICD-10-CM

## 2023-04-02 ENCOUNTER — Ambulatory Visit: Admit: 2023-04-02 | Discharge: 2023-04-02 | Payer: PRIVATE HEALTH INSURANCE

## 2023-04-02 DIAGNOSIS — D45 Polycythemia vera: Principal | ICD-10-CM

## 2023-04-06 DIAGNOSIS — D45 Polycythemia vera: Principal | ICD-10-CM

## 2023-04-06 MED ORDER — HYDROXYUREA 500 MG CAPSULE
ORAL_CAPSULE | Freq: Every day | ORAL | 0 refills | 30.00000 days
Start: 2023-04-06 — End: ?

## 2023-04-07 DIAGNOSIS — D45 Polycythemia vera: Principal | ICD-10-CM

## 2023-04-07 MED ORDER — HYDROXYUREA 500 MG CAPSULE
ORAL_CAPSULE | Freq: Every day | ORAL | 0 refills | 30 days | Status: CP
Start: 2023-04-07 — End: ?

## 2023-04-25 MED ORDER — HYDROXYUREA 500 MG CAPSULE
ORAL_CAPSULE | Freq: Every day | ORAL | 0 refills | 30 days | Status: CP
Start: 2023-04-25 — End: ?

## 2023-04-30 ENCOUNTER — Ambulatory Visit: Admit: 2023-04-30 | Discharge: 2023-05-01 | Payer: PRIVATE HEALTH INSURANCE

## 2023-04-30 DIAGNOSIS — D45 Polycythemia vera: Principal | ICD-10-CM

## 2023-06-07 DIAGNOSIS — D45 Polycythemia vera: Principal | ICD-10-CM

## 2023-06-07 MED ORDER — HYDROXYUREA 500 MG CAPSULE
ORAL | 0 refills | 30 days | Status: CP
Start: 2023-06-07 — End: ?

## 2023-07-03 ENCOUNTER — Ambulatory Visit: Admit: 2023-07-03 | Discharge: 2023-07-03 | Payer: PRIVATE HEALTH INSURANCE

## 2023-07-03 DIAGNOSIS — D45 Polycythemia vera: Principal | ICD-10-CM

## 2023-07-18 ENCOUNTER — Ambulatory Visit: Admit: 2023-07-18 | Discharge: 2023-07-19 | Payer: PRIVATE HEALTH INSURANCE

## 2023-07-18 DIAGNOSIS — D45 Polycythemia vera: Principal | ICD-10-CM

## 2023-07-24 ENCOUNTER — Ambulatory Visit: Admit: 2023-07-24 | Payer: PRIVATE HEALTH INSURANCE

## 2024-01-23 ENCOUNTER — Ambulatory Visit: Admit: 2024-01-23

## 2024-01-29 ENCOUNTER — Ambulatory Visit: Admit: 2024-01-29 | Discharge: 2024-01-29

## 2024-01-29 DIAGNOSIS — D473 Essential (hemorrhagic) thrombocythemia: Principal | ICD-10-CM

## 2024-01-29 DIAGNOSIS — D45 Polycythemia vera: Principal | ICD-10-CM

## 2024-01-29 MED ORDER — HYDROXYUREA 500 MG CAPSULE
ORAL_CAPSULE | Freq: Every day | ORAL | 0 refills | 30 days | Status: CP
Start: 2024-01-29 — End: ?

## 2024-02-20 ENCOUNTER — Ambulatory Visit
Admit: 2024-02-20 | Discharge: 2024-02-21 | Payer: PRIVATE HEALTH INSURANCE | Attending: Internal Medicine | Primary: Internal Medicine

## 2024-02-20 DIAGNOSIS — M1611 Unilateral primary osteoarthritis, right hip: Principal | ICD-10-CM

## 2024-03-18 ENCOUNTER — Ambulatory Visit: Admit: 2024-03-18 | Discharge: 2024-03-18 | Payer: Medicaid (Managed Care)

## 2024-03-18 DIAGNOSIS — D45 Polycythemia vera: Principal | ICD-10-CM

## 2024-03-18 MED ORDER — CELECOXIB 200 MG CAPSULE
ORAL_CAPSULE | Freq: Two times a day (BID) | ORAL | 3 refills | 30.00 days | Status: CP | PRN
Start: 2024-03-18 — End: ?

## 2024-04-14 DIAGNOSIS — D45 Polycythemia vera: Principal | ICD-10-CM

## 2024-04-14 MED ORDER — HYDROXYUREA 500 MG CAPSULE
ORAL_CAPSULE | Freq: Every day | ORAL | 0 refills | 30.00000 days | Status: CP
Start: 2024-04-14 — End: ?

## 2024-05-06 ENCOUNTER — Ambulatory Visit: Admit: 2024-05-06 | Discharge: 2024-05-06 | Payer: Medicaid (Managed Care)

## 2024-05-06 DIAGNOSIS — D45 Polycythemia vera: Principal | ICD-10-CM

## 2024-05-06 DIAGNOSIS — D473 Essential (hemorrhagic) thrombocythemia: Principal | ICD-10-CM

## 2024-05-06 MED ORDER — HYDROXYUREA 500 MG CAPSULE
ORAL_CAPSULE | Freq: Two times a day (BID) | ORAL | 12 refills | 30.00000 days | Status: CP
Start: 2024-05-06 — End: ?

## 2024-05-15 DIAGNOSIS — M1611 Unilateral primary osteoarthritis, right hip: Principal | ICD-10-CM

## 2024-08-12 ENCOUNTER — Ambulatory Visit: Admit: 2024-08-12 | Discharge: 2024-08-12 | Payer: Medicaid (Managed Care)

## 2024-08-12 DIAGNOSIS — M1990 Unspecified osteoarthritis, unspecified site: Principal | ICD-10-CM

## 2024-08-12 DIAGNOSIS — D45 Polycythemia vera: Principal | ICD-10-CM

## 2024-08-12 MED ORDER — CELECOXIB 200 MG CAPSULE
ORAL_CAPSULE | Freq: Two times a day (BID) | ORAL | 3 refills | 30.00000 days | Status: CP | PRN
Start: 2024-08-12 — End: ?

## 2024-09-08 DIAGNOSIS — D45 Polycythemia vera: Principal | ICD-10-CM

## 2024-09-09 ENCOUNTER — Ambulatory Visit: Admit: 2024-09-09 | Payer: Medicaid (Managed Care)

## 2024-09-30 ENCOUNTER — Ambulatory Visit: Admit: 2024-09-30 | Discharge: 2024-10-01 | Payer: Worker's Compensation

## 2024-09-30 ENCOUNTER — Ambulatory Visit
Admit: 2024-09-30 | Discharge: 2024-10-01 | Payer: Worker's Compensation | Attending: Hematology & Oncology | Primary: Hematology & Oncology

## 2024-09-30 DIAGNOSIS — D45 Polycythemia vera: Principal | ICD-10-CM

## 2024-10-07 DIAGNOSIS — M1611 Unilateral primary osteoarthritis, right hip: Principal | ICD-10-CM

## 2024-10-28 ENCOUNTER — Ambulatory Visit
Admit: 2024-10-28 | Discharge: 2024-10-28 | Payer: Medicaid (Managed Care) | Attending: Hematology & Oncology | Primary: Hematology & Oncology

## 2024-10-28 ENCOUNTER — Ambulatory Visit: Admit: 2024-10-28 | Discharge: 2024-10-28 | Payer: Medicaid (Managed Care)

## 2024-10-28 DIAGNOSIS — D45 Polycythemia vera: Principal | ICD-10-CM

## 2024-10-28 MED ORDER — ONDANSETRON HCL 4 MG TABLET
ORAL_TABLET | Freq: Two times a day (BID) | ORAL | 3 refills | 90.00000 days | Status: CP | PRN
Start: 2024-10-28 — End: 2025-10-28

## 2024-11-13 ENCOUNTER — Ambulatory Visit: Admit: 2024-11-13 | Payer: Medicaid (Managed Care)

## 2024-12-19 ENCOUNTER — Ambulatory Visit: Admit: 2024-12-19 | Payer: Medicaid (Managed Care)

## 2024-12-30 ENCOUNTER — Ambulatory Visit: Admit: 2024-12-30 | Discharge: 2024-12-30 | Payer: Medicaid (Managed Care)

## 2024-12-30 ENCOUNTER — Ambulatory Visit
Admit: 2024-12-30 | Discharge: 2024-12-30 | Payer: Medicaid (Managed Care) | Attending: Hematology & Oncology | Primary: Hematology & Oncology

## 2024-12-30 DIAGNOSIS — D45 Polycythemia vera: Principal | ICD-10-CM
